# Patient Record
Sex: Male | Born: 1965 | Race: Black or African American | Hispanic: No | Marital: Single | Smoking: Never smoker
Health system: Southern US, Community
[De-identification: ages and names within clinical notes are randomized; demographics above are authoritative.]

## PROBLEM LIST (undated history)

## (undated) DIAGNOSIS — K5190 Ulcerative colitis, unspecified, without complications: Secondary | ICD-10-CM

## (undated) HISTORY — PX: COLONOSCOPY: SHX174

## (undated) HISTORY — PX: ABDOMINAL SURGERY: SHX537

---

## 2015-04-15 ENCOUNTER — Encounter (HOSPITAL_BASED_OUTPATIENT_CLINIC_OR_DEPARTMENT_OTHER): Payer: Self-pay

## 2015-04-15 ENCOUNTER — Emergency Department (HOSPITAL_BASED_OUTPATIENT_CLINIC_OR_DEPARTMENT_OTHER)
Admission: EM | Admit: 2015-04-15 | Discharge: 2015-04-15 | Disposition: A | Discharge status: Home / Self Care | Payer: No Typology Code available for payment source | Attending: Emergency Medicine | Admitting: Emergency Medicine

## 2015-04-15 DIAGNOSIS — R350 Frequency of micturition: Secondary | ICD-10-CM | POA: Diagnosis present

## 2015-04-15 DIAGNOSIS — N390 Urinary tract infection, site not specified: Secondary | ICD-10-CM | POA: Diagnosis not present

## 2015-04-15 HISTORY — DX: Ulcerative colitis, unspecified, without complications: K51.90

## 2015-04-15 LAB — URINALYSIS, ROUTINE W REFLEX MICROSCOPIC
Bilirubin Urine: NEGATIVE
Glucose, UA: NEGATIVE mg/dL
Hgb urine dipstick: NEGATIVE
Ketones, ur: NEGATIVE mg/dL
NITRITE: NEGATIVE
PH: 5.5 (ref 5.0–8.0)
Protein, ur: NEGATIVE mg/dL
SPECIFIC GRAVITY, URINE: 1.017 (ref 1.005–1.030)

## 2015-04-15 LAB — URINE MICROSCOPIC-ADD ON
RBC / HPF: NONE SEEN RBC/hpf (ref 0–5)
SQUAMOUS EPITHELIAL / LPF: NONE SEEN

## 2015-04-15 LAB — OCCULT BLOOD X 1 CARD TO LAB, STOOL: FECAL OCCULT BLD: NEGATIVE

## 2015-04-15 MED ORDER — CEPHALEXIN 500 MG PO CAPS: 1000.0000 mg | ORAL_CAPSULE | Freq: Two times a day (BID) | ORAL | Status: DC

## 2015-04-15 NOTE — ED Provider Notes (Signed)
CSN: [id number]     Arrival date & time 04/15/15  [id number] History   First MD Initiated Contact with Patient 04/15/15 [phone]     Chief Complaint  Patient presents with  . Urinary Frequency     (Consider location/radiation/quality/duration/timing/severity/associated sxs/prior Treatment) HPI   49 year old male with hx of UC presents with urinary frequency.  Pt report having frequency and occasional urgency for the past 2 days.  Today he also notice trace of blood in his urine which concerns him.  He has has blood mixed with stools in the past from UC.  He denies having abd pain, rectal pain, rectal bleeding, burning on urination, having penile discharge or rash.  He denies any recent injury or trauma.  He is sexually active.  Has had remote hx of STI including GC/Ch many years ago.  Pt cannot recall last UC flare.    Past Medical History  Diagnosis Date  . Ulcerative colitis [facility](HCC)    Past Surgical History  Procedure Laterality Date  . Abdominal surgery    . Colonoscopy     No family history on file. Social History  Substance Use Topics  . Smoking status: Never Smoker   . Smokeless tobacco: None  . Alcohol Use: No    Review of Systems  All other systems reviewed and are negative.     Allergies  Review of patient's allergies indicates no known allergies.  Home Medications   Prior to Admission medications   Not on File   BP 131/83 mmHg  Pulse 71  Temp(Src) 98.3 F (36.8 C) (Oral)  Resp 16  Ht 6' (1.829 m)  Wt 88.451 kg  BMI 26.44 kg/m2  SpO2 99% Physical Exam  Constitutional: He appears well-developed and well-nourished. No distress.  HENT:  Head: Atraumatic.  Eyes: Conjunctivae are normal.  Neck: Neck supple.  Cardiovascular: Normal rate and regular rhythm.   Pulmonary/Chest: Effort normal and breath sounds normal.  Abdominal: Soft. There is no tenderness.  Genitourinary:  Chaperone present during exam.  No inguinal lymphadenopathy or inguinal hernia noted.   Normal circumcised penis free of lesion or rash.  Testicles are nontender, normal scrotum.    On digital rectal exam, pt has normal rectal tone.  No anal fissure, no mass, normal color stool and hemoccult is negative.    No CVA tenderness  Neurological: He is alert.  Skin: No rash noted.  Psychiatric: He has a normal mood and affect.  Nursing note and vitals reviewed.   ED Course  Procedures (including critical care time)  Pt here with urinary urgency/frequency and report hematuria.  No CVA tenderness to suggest kidney stone.  abd non tender.  Will check UA.  Pt otherwise in no acute discomfort.  10:51 AM UA showing moderate leukocytes and 6-30 WBC.  No evidence of hematuria.  Given his sxs a urine culture were sent and pt d/c with keflex as treatment for suspected UTI.  Return precaution discussed.  Pt without penile discharge and declined STD testing.   Labs Review Labs Reviewed  URINALYSIS, ROUTINE W REFLEX MICROSCOPIC (NOT AT [facility]) - Abnormal; Notable for the following:    Leukocytes, UA MODERATE (*)    All other components within normal limits  URINE MICROSCOPIC-ADD ON - Abnormal; Notable for the following:    Bacteria, UA FEW (*)    All other components within normal limits  URINE CULTURE  OCCULT BLOOD X 1 CARD TO LAB, STOOL  POC OCCULT BLOOD, ED    Imaging  Review No results found. I have personally reviewed and evaluated these images and lab results as part of my medical decision-making.   EKG Interpretation None      MDM   Final diagnoses:  UTI (lower urinary tract infection)    BP 131/83 mmHg  Pulse 71  Temp(Src) 98.3 F (36.8 C) (Oral)  Resp 16  Ht 6' (1.829 m)  Wt 88.451 kg  BMI 26.44 kg/m2  SpO2 99%     Fayrene Helper, PA-C 04/15/15 1056  Gwyneth Sprout, MD 04/15/15 1419

## 2015-04-15 NOTE — ED Notes (Signed)
Reports urinary frequency.  But this am patient went to bathroom and there was blood in toliet but unsure if from his ulcerative colitis or urine.

## 2015-04-15 NOTE — Discharge Instructions (Signed)

## 2015-04-15 NOTE — ED Notes (Signed)
Denies abdominal pain.  Unsure when last ulcerative colitis flare up was.

## 2015-04-16 LAB — URINE CULTURE: Culture: NO GROWTH

## 2016-04-07 ENCOUNTER — Encounter (HOSPITAL_BASED_OUTPATIENT_CLINIC_OR_DEPARTMENT_OTHER): Payer: Self-pay | Admitting: Emergency Medicine

## 2016-04-07 ENCOUNTER — Emergency Department (HOSPITAL_BASED_OUTPATIENT_CLINIC_OR_DEPARTMENT_OTHER)
Admission: EM | Admit: 2016-04-07 | Discharge: 2016-04-07 | Disposition: A | Discharge status: Home / Self Care | Payer: No Typology Code available for payment source | Attending: Emergency Medicine | Admitting: Emergency Medicine

## 2016-04-07 DIAGNOSIS — R350 Frequency of micturition: Secondary | ICD-10-CM | POA: Insufficient documentation

## 2016-04-07 LAB — URINALYSIS, ROUTINE W REFLEX MICROSCOPIC
BILIRUBIN URINE: NEGATIVE
Glucose, UA: NEGATIVE mg/dL
HGB URINE DIPSTICK: NEGATIVE
Ketones, ur: NEGATIVE mg/dL
NITRITE: NEGATIVE
PROTEIN: NEGATIVE mg/dL
SPECIFIC GRAVITY, URINE: 1.015 (ref 1.005–1.030)
pH: 6 (ref 5.0–8.0)

## 2016-04-07 LAB — CBG MONITORING, ED: GLUCOSE-CAPILLARY: 102 mg/dL — AB (ref 65–99)

## 2016-04-07 LAB — URINE MICROSCOPIC-ADD ON

## 2016-04-07 MED ORDER — CEPHALEXIN 500 MG PO CAPS: 500.0000 mg | ORAL_CAPSULE | Freq: Three times a day (TID) | ORAL | 0 refills | Status: DC

## 2016-04-07 MED FILL — CEPHALEXIN 500 MG CAPSULE: 500 | 7 days supply | Qty: 21 | Fill #0

## 2016-04-07 NOTE — ED Triage Notes (Signed)
Pt reports urinary frequency x 2 d; denies pain.

## 2016-04-07 NOTE — ED Provider Notes (Signed)
[facility]   CSN: [id number] Arrival date & time: 04/07/16  1027     History   Chief Complaint Chief Complaint  Patient presents with  . Urinary Frequency    HPI Marcus Young is a 50 y.o. male.  HPI Patient presents the emergency department with complaints of urinary frequency over the past 2 days.  He states he is intermittently had this as a symptom over the past several years.  He states this is normally what occurs when he gets a urinary tract infection he is on antibiotics for several days and then this clears up.  Denies abdominal pain.  No nausea or vomiting.  No fevers or chills.  No diarrhea.  No penile pain.  Denies penile drainage or discharge.   Past Medical History:  Diagnosis Date  . Ulcerative colitis (HCC)     There are no active problems to display for this patient.   Past Surgical History:  Procedure Laterality Date  . ABDOMINAL SURGERY    . COLONOSCOPY         Home Medications    Prior to Admission medications   Medication Sig Start Date End Date Taking? Authorizing Provider  cephALEXin (KEFLEX) 500 MG capsule Take 1 capsule (500 mg total) by mouth 3 (three) times daily. 04/07/16   Azalia BilisKevin Cherae Marton, MD    Family History History reviewed. No pertinent family history.  Social History Social History  Substance Use Topics  . Smoking status: Never Smoker  . Smokeless tobacco: Never Used  . Alcohol use No     Allergies   Patient has no known allergies.   Review of Systems Review of Systems  All other systems reviewed and are negative.    Physical Exam Updated Vital Signs BP 129/85 (BP Location: Right Arm)   Pulse 95   Temp 98 F (36.7 C) (Oral)   Resp 18   Ht 6' (1.829 m)   Wt 198 lb (89.8 kg)   SpO2 100%   BMI 26.85 kg/m   Physical Exam  Constitutional: He is oriented to person, place, and time. He appears well-developed and well-nourished.  HENT:  Head: Normocephalic.  Eyes: EOM are normal.    Neck: Normal range of motion.  Pulmonary/Chest: Effort normal.  Abdominal: Soft. He exhibits no distension. There is no tenderness.  Musculoskeletal: Normal range of motion.  Neurological: He is alert and oriented to person, place, and time.  Psychiatric: He has a normal mood and affect.  Nursing note and vitals reviewed.    ED Treatments / Results  Labs (all labs ordered are listed, but only abnormal results are displayed) Labs Reviewed  URINALYSIS, ROUTINE W REFLEX MICROSCOPIC (NOT AT [facility]) - Abnormal; Notable for the following:       Result Value   Leukocytes, UA TRACE (*)    All other components within normal limits  URINE MICROSCOPIC-ADD ON - Abnormal; Notable for the following:    Squamous Epithelial / LPF 0-5 (*)    Bacteria, UA FEW (*)    Casts HYALINE CASTS (*)    All other components within normal limits  CBG MONITORING, ED - Abnormal; Notable for the following:    Glucose-Capillary 102 (*)    All other components within normal limits  URINE CULTURE    EKG  EKG Interpretation None       Radiology No results found.  Procedures Procedures (including critical care time)  Medications Ordered in ED Medications - No data to display  Initial Impression / Assessment and Plan / ED Course  I have reviewed the triage vital signs and the nursing notes.  Pertinent labs & imaging results that were available during my care of the patient were reviewed by me and considered in my medical decision making (see chart for details).  Clinical Course     Urine is not convincing for infection but he does have some leukocytes in it.  Urine culture sent.  Patient be started on Keflex.  Some of this may be more bladder spasm.  Vas that he keep a bladder journal as well as a dietary journal as he does drink significant soft drinks and caffeine.  This likely is playing a role.  Outpatient urology follow-up.  Final Clinical Impressions(s) / ED Diagnoses   Final diagnoses:   Urinary frequency    New Prescriptions Discharge Medication List as of 04/07/2016 11:48 AM       Azalia BilisKevin Duvid Smalls, MD 04/07/16 1650

## 2016-04-07 NOTE — ED Notes (Signed)
Urine cx was obtained by clean catch, not catheterized; charted in error.

## 2016-04-09 LAB — URINE CULTURE: CULTURE: NO GROWTH

## 2016-04-15 ENCOUNTER — Encounter (HOSPITAL_BASED_OUTPATIENT_CLINIC_OR_DEPARTMENT_OTHER): Payer: Self-pay | Admitting: *Deleted

## 2016-04-15 ENCOUNTER — Emergency Department (HOSPITAL_BASED_OUTPATIENT_CLINIC_OR_DEPARTMENT_OTHER)
Admission: EM | Admit: 2016-04-15 | Discharge: 2016-04-15 | Disposition: A | Discharge status: Home / Self Care | Payer: No Typology Code available for payment source | Attending: Emergency Medicine | Admitting: Emergency Medicine

## 2016-04-15 DIAGNOSIS — N342 Other urethritis: Secondary | ICD-10-CM | POA: Insufficient documentation

## 2016-04-15 LAB — URINALYSIS, ROUTINE W REFLEX MICROSCOPIC
BILIRUBIN URINE: NEGATIVE
Glucose, UA: NEGATIVE mg/dL
HGB URINE DIPSTICK: NEGATIVE
KETONES UR: NEGATIVE mg/dL
Nitrite: NEGATIVE
PROTEIN: NEGATIVE mg/dL
Specific Gravity, Urine: 1.016 (ref 1.005–1.030)
pH: 5 (ref 5.0–8.0)

## 2016-04-15 LAB — URINALYSIS, MICROSCOPIC (REFLEX)

## 2016-04-15 MED ORDER — DOXYCYCLINE HYCLATE 100 MG PO CAPS: 100.0000 mg | ORAL_CAPSULE | Freq: Two times a day (BID) | ORAL | 0 refills | Status: DC

## 2016-04-15 MED ORDER — CEFTRIAXONE SODIUM 250 MG IJ SOLR
250.0000 mg | Freq: Once | INTRAMUSCULAR | Status: AC
  Administered 2016-04-15: 250 mg via INTRAMUSCULAR
  Filled 2016-04-15: qty 250

## 2016-04-15 MED FILL — DOXYCYCLINE HYC 100 MG CAP: 100 | 7 days supply | Qty: 14 | Fill #0

## 2016-04-15 NOTE — ED Triage Notes (Signed)
Pt c/o freq urination x 1 week , seen here 2 days ago for same pt completed ABX w/o improvement

## 2016-04-15 NOTE — ED Notes (Signed)
Pt states he has rocephin before w/o reaction

## 2016-04-15 NOTE — ED Provider Notes (Signed)
[facility]   CSN: [id number] Arrival date & time: 04/15/16  1347     History   Chief Complaint Chief Complaint  Patient presents with  . Urinary Frequency    HPI Marcus Young is a 50 y.o. male.  HPI Patient presents with 9 days of urinary frequency, mild dysuria and occasional cloudy discharge. Was seen 7 days ago and given prescription for Keflex. States he's had no significant improvement in his symptoms. Has not followed up with urology. Denies any fever or chills. No abdominal pain. No testicular swelling or pain. Has history of BPH and describes chronic hesitancy but this is not increased. No hematuria. States he is sexually active and mostly uses condoms. Past Medical History:  Diagnosis Date  . Ulcerative colitis (HCC)     There are no active problems to display for this patient.   Past Surgical History:  Procedure Laterality Date  . ABDOMINAL SURGERY    . COLONOSCOPY         Home Medications    Prior to Admission medications   Medication Sig Start Date End Date Taking? Authorizing Provider  cephALEXin (KEFLEX) 500 MG capsule Take 1 capsule (500 mg total) by mouth 3 (three) times daily. 04/07/16   Azalia Bilis, MD  doxycycline (VIBRAMYCIN) 100 MG capsule Take 1 capsule (100 mg total) by mouth 2 (two) times daily. One po bid x 7 days 04/15/16   Loren Racer, MD    Family History History reviewed. No pertinent family history.  Social History Social History  Substance Use Topics  . Smoking status: Never Smoker  . Smokeless tobacco: Never Used  . Alcohol use No     Allergies   Patient has no known allergies.   Review of Systems Review of Systems  Constitutional: Negative for chills.  Respiratory: Negative for shortness of breath.   Cardiovascular: Negative for chest pain.  Gastrointestinal: Negative for abdominal pain, diarrhea, nausea and vomiting.  Genitourinary: Positive for difficulty urinating, discharge,  dysuria and frequency. Negative for flank pain, hematuria, penile pain, penile swelling, scrotal swelling, testicular pain and urgency.  Musculoskeletal: Negative for back pain and myalgias.  Skin: Negative for rash and wound.  Neurological: Negative for dizziness, weakness and numbness.  All other systems reviewed and are negative.    Physical Exam Updated Vital Signs BP 119/89 (BP Location: Left Arm)   Pulse 81   Temp 98.1 F (36.7 C) (Oral)   Resp 18   Ht 6' (1.829 m)   Wt 198 lb (89.8 kg)   SpO2 97%   BMI 26.85 kg/m   Physical Exam  Constitutional: He is oriented to person, place, and time. He appears well-developed and well-nourished.  HENT:  Head: Normocephalic and atraumatic.  Mouth/Throat: Oropharynx is clear and moist.  Eyes: EOM are normal. Pupils are equal, round, and reactive to light.  Neck: Normal range of motion. Neck supple.  Cardiovascular: Normal rate and regular rhythm.   Pulmonary/Chest: Effort normal and breath sounds normal.  Abdominal: Soft. Bowel sounds are normal. There is no tenderness. There is no rebound and no guarding.  Genitourinary: Rectum normal, prostate normal and penis normal. No penile tenderness.  Genitourinary Comments: Mildly enlarged prostate without bogginess or tenderness. No testicular swelling or tenderness. Testicles are in normal lie. No inguinal lymphadenopathy. No masses or rashes present. Circumcised penis without appreciated discharge.  Musculoskeletal: Normal range of motion. He exhibits no edema or tenderness.  No CVA tenderness bilaterally.  Neurological: He is alert and  oriented to person, place, and time.  Skin: Skin is warm and dry. No rash noted. No erythema.  Psychiatric: He has a normal mood and affect. His behavior is normal.  Nursing note and vitals reviewed.    ED Treatments / Results  Labs (all labs ordered are listed, but only abnormal results are displayed) Labs Reviewed  URINALYSIS, ROUTINE W REFLEX  MICROSCOPIC - Abnormal; Notable for the following:       Result Value   Leukocytes, UA SMALL (*)    All other components within normal limits  URINALYSIS, MICROSCOPIC (REFLEX) - Abnormal; Notable for the following:    Bacteria, UA FEW (*)    Squamous Epithelial / LPF 0-5 (*)    All other components within normal limits  GC/CHLAMYDIA PROBE AMP ([place]) NOT AT [facility]    EKG  EKG Interpretation None       Radiology No results found.  Procedures Procedures (including critical care time)  Medications Ordered in ED Medications  cefTRIAXone (ROCEPHIN) injection 250 mg (250 mg Intramuscular Given 04/15/16 1617)     Initial Impression / Assessment and Plan / ED Course  I have reviewed the triage vital signs and the nursing notes.  Pertinent labs & imaging results that were available during my care of the patient were reviewed by me and considered in my medical decision making (see chart for details).  Clinical Course    Patient's urine culture from 7 days ago with no growth. Still has a few bacteria and small leukocytes in the urine. Think this is likely related to urethritis. We'll send off cultures for GC and treat. Patient is advised to have all sexual partners evaluated and treated. Return precautions given.  Final Clinical Impressions(s) / ED Diagnoses   Final diagnoses:  Urethritis    New Prescriptions New Prescriptions   DOXYCYCLINE (VIBRAMYCIN) 100 MG CAPSULE    Take 1 capsule (100 mg total) by mouth 2 (two) times daily. One po bid x 7 days     Loren Raceravid Aarnav Steagall, MD 04/15/16 [phone]

## 2016-04-15 NOTE — ED Notes (Signed)
Pt states he has not followed up with urology because he cannot afford it.

## 2016-04-17 LAB — GC/CHLAMYDIA PROBE AMP (~~LOC~~) NOT AT ARMC
CHLAMYDIA, DNA PROBE: NEGATIVE
NEISSERIA GONORRHEA: NEGATIVE

## 2016-12-21 ENCOUNTER — Encounter (HOSPITAL_BASED_OUTPATIENT_CLINIC_OR_DEPARTMENT_OTHER): Payer: Self-pay | Admitting: *Deleted

## 2016-12-21 ENCOUNTER — Emergency Department (HOSPITAL_BASED_OUTPATIENT_CLINIC_OR_DEPARTMENT_OTHER)
Admission: EM | Admit: 2016-12-21 | Discharge: 2016-12-21 | Disposition: A | Discharge status: Home / Self Care | Payer: No Typology Code available for payment source | Attending: Emergency Medicine | Admitting: Emergency Medicine

## 2016-12-21 DIAGNOSIS — N390 Urinary tract infection, site not specified: Secondary | ICD-10-CM | POA: Insufficient documentation

## 2016-12-21 DIAGNOSIS — N3000 Acute cystitis without hematuria: Secondary | ICD-10-CM | POA: Insufficient documentation

## 2016-12-21 LAB — URINALYSIS, ROUTINE W REFLEX MICROSCOPIC
Bilirubin Urine: NEGATIVE
GLUCOSE, UA: NEGATIVE mg/dL
HGB URINE DIPSTICK: NEGATIVE
Ketones, ur: NEGATIVE mg/dL
Nitrite: POSITIVE — AB
PH: 5 (ref 5.0–8.0)
Protein, ur: NEGATIVE mg/dL
SPECIFIC GRAVITY, URINE: 1.015 (ref 1.005–1.030)

## 2016-12-21 LAB — URINALYSIS, MICROSCOPIC (REFLEX): RBC / HPF: NONE SEEN RBC/hpf (ref 0–5)

## 2016-12-21 MED ORDER — CEPHALEXIN 500 MG PO CAPS: 500.0000 mg | ORAL_CAPSULE | Freq: Three times a day (TID) | ORAL | 0 refills | Status: AC

## 2016-12-21 MED ORDER — CEPHALEXIN 250 MG PO CAPS
500.0000 mg | ORAL_CAPSULE | Freq: Once | ORAL | Status: AC
  Administered 2016-12-21: 500 mg via ORAL
  Filled 2016-12-21: qty 2

## 2016-12-21 MED FILL — CEPHALEXIN 500 MG CAPSULE: 500 | 10 days supply | Qty: 30 | Fill #0

## 2016-12-21 NOTE — ED Provider Notes (Signed)
[facility]   CSN: [id number] Arrival date & time: 12/21/16  1335     History   Chief Complaint Chief Complaint  Patient presents with  . Urinary Frequency    HPI Marcus Young is a 51 y.o. male.  The history is provided by the patient.  Dysuria   This is a recurrent problem. The current episode started more than 2 days ago. The problem occurs every urination. The problem has not changed since onset.Quality: no pain, freqwuency. The pain is at a severity of 0/10. The patient is experiencing no pain. There has been no fever. Associated symptoms include frequency. Pertinent negatives include no chills, no sweats, no nausea, no vomiting, no discharge, no hematuria, no hesitancy, no urgency and no flank pain. Treatments tried: AZO. His past medical history is significant for recurrent UTIs. His past medical history does not include kidney stones, single kidney or catheterization.    Past Medical History:  Diagnosis Date  . Ulcerative colitis (HCC)     There are no active problems to display for this patient.   Past Surgical History:  Procedure Laterality Date  . ABDOMINAL SURGERY    . COLONOSCOPY         Home Medications    Prior to Admission medications   Medication Sig Start Date End Date Taking? Authorizing Provider  cephALEXin (KEFLEX) 500 MG capsule Take 1 capsule (500 mg total) by mouth 3 (three) times daily. 04/07/16   Azalia Bilis, MD  doxycycline (VIBRAMYCIN) 100 MG capsule Take 1 capsule (100 mg total) by mouth 2 (two) times daily. One po bid x 7 days 04/15/16   Loren Racer, MD    Family History No family history on file.  Social History Social History  Substance Use Topics  . Smoking status: Never Smoker  . Smokeless tobacco: Never Used  . Alcohol use No     Allergies   Patient has no known allergies.   Review of Systems Review of Systems  Constitutional: Negative for chills, diaphoresis, fatigue and fever.    HENT: Negative for congestion.   Respiratory: Negative for chest tightness, shortness of breath and wheezing.   Cardiovascular: Negative for chest pain.  Gastrointestinal: Negative for abdominal distention, abdominal pain, blood in stool, diarrhea, nausea and vomiting.  Genitourinary: Positive for frequency. Negative for decreased urine volume, difficulty urinating, dysuria, flank pain, genital sores, hematuria, hesitancy and urgency.  Musculoskeletal: Negative for back pain.  Skin: Negative for rash and wound.  Neurological: Negative for dizziness and headaches.  Psychiatric/Behavioral: Negative for agitation.  All other systems reviewed and are negative.    Physical Exam Updated Vital Signs BP 119/81   Pulse 88   Temp 98.1 F (36.7 C) (Oral)   Resp 20   Ht 6' (1.829 m)   Wt 88.5 kg (195 lb)   SpO2 98%   BMI 26.45 kg/m   Physical Exam  Constitutional: He appears well-developed and well-nourished. No distress.  HENT:  Head: Normocephalic and atraumatic.  Eyes: Pupils are equal, round, and reactive to light. Conjunctivae are normal.  Neck: Neck supple.  Cardiovascular: Normal rate and intact distal pulses.   No murmur heard. Pulmonary/Chest: Effort normal and breath sounds normal. No respiratory distress. He has no wheezes. He exhibits no tenderness.  Abdominal: Soft. He exhibits no distension. There is no tenderness.  Musculoskeletal: He exhibits no edema or tenderness.  Neurological: He is alert. No sensory deficit. He exhibits normal muscle tone.  Skin: Skin is warm and  dry. He is not diaphoretic. No erythema. No pallor.  Psychiatric: He has a normal mood and affect.  Nursing note and vitals reviewed.    ED Treatments / Results  Labs (all labs ordered are listed, but only abnormal results are displayed) Labs Reviewed  URINALYSIS, ROUTINE W REFLEX MICROSCOPIC - Abnormal; Notable for the following:       Result Value   Color, Urine ORANGE (*)    Nitrite POSITIVE  (*)    Leukocytes, UA SMALL (*)    All other components within normal limits  URINALYSIS, MICROSCOPIC (REFLEX) - Abnormal; Notable for the following:    Bacteria, UA FEW (*)    Squamous Epithelial / LPF 0-5 (*)    All other components within normal limits  URINE CULTURE    EKG  EKG Interpretation None       Radiology No results found.  Procedures Procedures (including critical care time)  Medications Ordered in ED Medications  cephALEXin (KEFLEX) capsule 500 mg (500 mg Oral Given 12/21/16 1512)     Initial Impression / Assessment and Plan / ED Course  I have reviewed the triage vital signs and the nursing notes.  Pertinent labs & imaging results that were available during my care of the patient were reviewed by me and considered in my medical decision making (see chart for details).     Marcus Young is a 51 y.o. male with a past medical history significant for ulcerative colitis and recurrent urinary tract infections who presents with urinary frequency. Patient says that for the last week, he has had urinary frequency. He reports that he has no itching like he has in the past but reports that he began taking Azo. He says that he continues to have urinary frequency which is consistent with prior UTIs. He denies any abdominal pain. Patient denies fevers, chills, nausea, vomiting, or changes in bowel movements. Denies any recent dramatic injuries. He denies any scrotal pain, penile pain, or any ulcerations in the groin. He denies any other complaints.  On exam, abdomen nontender. Lungs clear. No focal neurologic deficits. Patient in no pain. No CVA tenderness. His vital signs reassuring.  Urinalysis performed showing evidence of UTI. Patient was nitrite and leukocyte positive with bacteria. Given patient's history and these findings, patient will be treated for UTI. Urine culture will be sent.  Given lack of CVA tenderness, nausea, vomiting, or fevers, doubt patient has  pyelonephritis. Patient denies history of diabetes or other significant abnormalities. Anticipate following up with PCP to determine etiology of his recurrent UTIs.  Patient given dose of antibiotics in the ED. Patient will be prescribed antibiotics at discharge. Patient will be given follow up instructions with PCP and a urologist given his recurrent UTIs. Patient understood return precautions for new or worsened symptoms. Patient discharged in good condition.   Final Clinical Impressions(s) / ED Diagnoses   Final diagnoses:  Lower urinary tract infectious disease  Acute cystitis without hematuria    New Prescriptions Discharge Medication List as of 12/21/2016  3:18 PM    START taking these medications   Details  !! cephALEXin (KEFLEX) 500 MG capsule Take 1 capsule (500 mg total) by mouth 3 (three) times daily., Starting Mon 12/21/2016, Until Thu 12/31/2016, Print     !! - Potential duplicate medications found. Please discuss with provider.     Clinical Impression: 1. Lower urinary tract infectious disease   2. Acute cystitis without hematuria     Disposition: Discharge  Condition: Good  I  have discussed the results, Dx and Tx plan with the pt(& family if present). He/she/they expressed understanding and agree(s) with the plan. Discharge instructions discussed at great length. Strict return precautions discussed and pt &/or family have verbalized understanding of the instructions. No further questions at time of discharge.    Discharge Medication List as of 12/21/2016  3:18 PM    START taking these medications   Details  !! cephALEXin (KEFLEX) 500 MG capsule Take 1 capsule (500 mg total) by mouth 3 (three) times daily., Starting Mon 12/21/2016, Until Thu 12/31/2016, Print     !! - Potential duplicate medications found. Please discuss with provider.      Follow Up: [facility] [address] [place]  [id number] [phone] Schedule an appointment as soon as possible for a visit    [facility] [address] [place] [address] [phone] Schedule an appointment as soon as possible for a visit       Tegeler, Canary Brim, MD 12/22/16 [phone]

## 2016-12-21 NOTE — ED Notes (Signed)
ED Provider at bedside. 

## 2016-12-21 NOTE — Discharge Instructions (Signed)
Please take her antibiotics to treat the urinary tract infection. Please stay hydrated. Please follow-up with a primary care physician for further management. Also included is a referral to urology for workup of your recurrent urinary tract infections. If any symptoms change or worsen or you begin having nausea with vomiting, fevers, or chills, please return to the nearest emergency department.

## 2016-12-21 NOTE — ED Triage Notes (Signed)
Urinary frequency. States he has been taking OTC AZO.

## 2016-12-23 LAB — URINE CULTURE: Culture: NO GROWTH

## 2016-12-28 ENCOUNTER — Encounter (HOSPITAL_BASED_OUTPATIENT_CLINIC_OR_DEPARTMENT_OTHER): Payer: Self-pay

## 2016-12-28 ENCOUNTER — Emergency Department (HOSPITAL_BASED_OUTPATIENT_CLINIC_OR_DEPARTMENT_OTHER)
Admission: EM | Admit: 2016-12-28 | Discharge: 2016-12-28 | Disposition: A | Discharge status: Home / Self Care | Payer: Self-pay | Attending: Emergency Medicine | Admitting: Emergency Medicine

## 2016-12-28 DIAGNOSIS — R3915 Urgency of urination: Secondary | ICD-10-CM | POA: Insufficient documentation

## 2016-12-28 DIAGNOSIS — R350 Frequency of micturition: Secondary | ICD-10-CM

## 2016-12-28 LAB — URINALYSIS, MICROSCOPIC (REFLEX)

## 2016-12-28 LAB — URINALYSIS, ROUTINE W REFLEX MICROSCOPIC
Bilirubin Urine: NEGATIVE
GLUCOSE, UA: NEGATIVE mg/dL
Hgb urine dipstick: NEGATIVE
Ketones, ur: NEGATIVE mg/dL
Nitrite: NEGATIVE
PH: 5.5 (ref 5.0–8.0)
Protein, ur: NEGATIVE mg/dL
Specific Gravity, Urine: 1.017 (ref 1.005–1.030)

## 2016-12-28 MED ORDER — METRONIDAZOLE 500 MG PO TABS
2000.0000 mg | ORAL_TABLET | Freq: Once | ORAL | Status: AC
  Administered 2016-12-28: 2000 mg via ORAL
  Filled 2016-12-28: qty 4

## 2016-12-28 MED ORDER — CEFTRIAXONE SODIUM 250 MG IJ SOLR
250.0000 mg | Freq: Once | INTRAMUSCULAR | Status: AC
  Administered 2016-12-28: 250 mg via INTRAMUSCULAR
  Filled 2016-12-28: qty 250

## 2016-12-28 MED ORDER — DOXYCYCLINE HYCLATE 100 MG PO CAPS: 100.0000 mg | ORAL_CAPSULE | Freq: Two times a day (BID) | ORAL | 0 refills | Status: AC

## 2016-12-28 MED ORDER — AZITHROMYCIN 250 MG PO TABS
1000.0000 mg | ORAL_TABLET | Freq: Once | ORAL | Status: AC
  Administered 2016-12-28: 1000 mg via ORAL
  Filled 2016-12-28: qty 4

## 2016-12-28 MED FILL — DOXYCYCLINE HYC 100 MG CAP: 100 | 7 days supply | Qty: 14 | Fill #0

## 2016-12-28 NOTE — ED Triage Notes (Signed)
C/o cont'd urinary freq/urgency, itching-now diarrhea-seen here last week and treated for UTI per pt-NAD-steady gait

## 2016-12-28 NOTE — ED Provider Notes (Signed)
[facility] Provider Note   CSN: [id number] Arrival date & time: 12/28/16  1358  History   Chief Complaint Chief Complaint  Patient presents with  . Urinary Frequency    HPI Marcus Young is a 51 y.o. male with history of recurrent UTIs presents to ED for evaluation of urinary frequency, urgency, itching of urinary tract x 7-10 days. States has had similar symptoms in past and usually resolve with antibiotics but not this time. He was evaluated in the ED for urinary frequency and urgency one week ago, finished Keflex which did not help his symptoms. Now also having itching and penile discharge. He is sexually active with women with inconsistent condom use, no new partners in the last month. Cannot remember her last STD testing.  Denies fevers, nausea, vomiting, abdominal pain, back pain, hematuria, dysuria, genital lesions. Reports history of ulcerative colitis untreated, stopped taking Lialda 2 years ago. Denies diarrhea, melena or hematochezia. Reports last prostate exam was one year ago, was told it was normal.  HPI  Past Medical History:  Diagnosis Date  . Ulcerative colitis (HCC)     There are no active problems to display for this patient.   Past Surgical History:  Procedure Laterality Date  . ABDOMINAL SURGERY    . COLONOSCOPY         Home Medications    Prior to Admission medications   Medication Sig Start Date End Date Taking? Authorizing Provider  cephALEXin (KEFLEX) 500 MG capsule Take 1 capsule (500 mg total) by mouth 3 (three) times daily. 12/21/16 12/31/16  Tegeler, Canary Brim, MD  doxycycline (VIBRAMYCIN) 100 MG capsule Take 1 capsule (100 mg total) by mouth 2 (two) times daily. 12/28/16 01/04/17  Liberty Handy, PA-C    Family History No family history on file.  Social History Social History  Substance Use Topics  . Smoking status: Never Smoker  . Smokeless tobacco: Never Used  . Alcohol use No     Allergies   Patient has no  known allergies.   Review of Systems Review of Systems  Constitutional: Negative for chills and fever.  Gastrointestinal: Negative for abdominal pain, nausea and vomiting.  Genitourinary: Positive for difficulty urinating, discharge, frequency and urgency. Negative for decreased urine volume, dysuria, genital sores, hematuria, penile swelling, scrotal swelling and testicular pain.       +itching     Physical Exam Updated Vital Signs BP 119/82 (BP Location: Left Arm)   Pulse 86   Temp 98.2 F (36.8 C) (Oral)   Resp 20   Ht 6' (1.829 m)   Wt 90.5 kg (199 lb 8.3 oz)   SpO2 98%   BMI 27.06 kg/m   Physical Exam  Constitutional: He is oriented to person, place, and time. He appears well-developed and well-nourished. No distress.  NAD.  HENT:  Head: Normocephalic and atraumatic.  Right Ear: External ear normal.  Left Ear: External ear normal.  Nose: Nose normal.  No perioral or intraoral lesions Posterior oropharynx and tonsils normal  Eyes: Conjunctivae and EOM are normal. No scleral icterus.  Neck: Normal range of motion. Neck supple.  Cardiovascular: Normal rate, regular rhythm, normal heart sounds and intact distal pulses.   No murmur heard. Pulmonary/Chest: Effort normal and breath sounds normal. He has no wheezes.  Abdominal: Soft. There is no tenderness.  No suprapubic or CVA tenderness  Genitourinary:  Genitourinary Comments: Decline rectal/prostate exam Chaperone was present during exam External genitalia normal without erythema, edema, tenderness or lesions.  Circumcised male.  No groin lymphadenopathy.  No meatus discharge.  Glans and shaft smooth without tenderness, lesions, masses or deformity.  Scrotum without lesions or edema.  Non tender testicles. Epididymis and spermatic cord without tenderness or masses, bilaterally. Cremasteric reflex intact.  Musculoskeletal: Normal range of motion. He exhibits no deformity.  Neurological: He is alert and oriented  to person, place, and time.  Skin: Skin is warm and dry. Capillary refill takes less than 2 seconds.  Psychiatric: He has a normal mood and affect. His behavior is normal. Judgment and thought content normal.  Nursing note and vitals reviewed.    ED Treatments / Results  Labs (all labs ordered are listed, but only abnormal results are displayed) Labs Reviewed  URINALYSIS, ROUTINE W REFLEX MICROSCOPIC - Abnormal; Notable for the following:       Result Value   Leukocytes, UA SMALL (*)    All other components within normal limits  URINALYSIS, MICROSCOPIC (REFLEX) - Abnormal; Notable for the following:    Bacteria, UA RARE (*)    Squamous Epithelial / LPF 0-5 (*)    All other components within normal limits  URINE CULTURE  GC/CHLAMYDIA PROBE AMP ([facility]) NOT AT [facility]    EKG  EKG Interpretation None       Radiology No results found.  Procedures Procedures (including critical care time)  Medications Ordered in ED Medications  cefTRIAXone (ROCEPHIN) injection 250 mg (250 mg Intramuscular Given 12/28/16 1633)  metroNIDAZOLE (FLAGYL) tablet 2,000 mg (2,000 mg Oral Given 12/28/16 1632)  azithromycin (ZITHROMAX) tablet 1,000 mg (1,000 mg Oral Given 12/28/16 1632)     Initial Impression / Assessment and Plan / ED Course  I have reviewed the triage vital signs and the nursing notes.  Pertinent labs & imaging results that were available during my care of the patient were reviewed by me and considered in my medical decision making (see chart for details).     51 year old male with history of untreated ulcerative colitis and recurrent UTIs presents to the ED for evaluation of frequency, urgency, penile itching and discharge for 7-10 days. Was evaluated in the ED last week for frequency and urgency, then U/A with positive nitrites and leukocytes. He has completed a course of Keflex, without resolution of symptoms and now endorsing penile discharge and itching. Denied symptoms of  ulcerative colitis flare including bloody diarrhea, fever, abdominal pain, tenesmus.  Exam is reassuring w/o CVA or suprapubic tenderness. Patient declined rectal and prostate exam.  Final Clinical Impressions(s) / ED Diagnoses   Final diagnoses:  Urgency of urination  Frequency of urination   Urinalysis without evidence of infection today. We will send urine culture and GC/chlamydia testing. Given high-risk sexual practices, patient will be empirically treated for STDs. We'll discharge with doxycycline, GU and GI follow-up. Patient does not have insurance and states he will likely be unable to follow-up with specialist. Provided him contact information for community clinic where he can establish care with a provider to follow up his recurrent UTI symptoms. Strict ED return precautions given. Pt verbalized understanding and is agreeable to plan.   New Prescriptions Discharge Medication List as of 12/28/2016  4:42 PM    START taking these medications   Details  doxycycline (VIBRAMYCIN) 100 MG capsule Take 1 capsule (100 mg total) by mouth 2 (two) times daily., Starting Mon 12/28/2016, Until Mon 01/04/2017, Print         Liberty Handy, PA-C 12/28/16 1724    Charlynne Pander, MD  12/29/16 1504  

## 2016-12-28 NOTE — Discharge Instructions (Signed)
You were evaluated in the ED for urinary frequency, urgency and itching. Your urinalysis did not show convincing evidence of infection, we are still waiting for culture to confirm. You were tested for gonorrhea and chlamydia today, the results are pending. You will be contacted if these results are positive and if your urine culture returns positive via phone call within 2-3 days.   Take doxycycline until completed, this will finish the course of STD treatment.   Contact urology and gastroenterology as soon as possible for further evaluation. In some rare cases ulcerative colitis can cause fistulas into your urinary tract, return to ED if you have dark penile discharge, diarrhea, fevers, abdominal pain or your symptoms persist or worsen.   Contact [facility] to establish care with a primary care provider for regular, routine medical care.  This clinic accepts patients without medical insurance and may help you establish care with GI and urology. A primary care provider can adjust your daily medications and give you refills.

## 2016-12-29 LAB — GC/CHLAMYDIA PROBE AMP (~~LOC~~) NOT AT ARMC
Chlamydia: NEGATIVE
NEISSERIA GONORRHEA: NEGATIVE

## 2016-12-29 LAB — URINE CULTURE: Culture: NO GROWTH

## 2017-01-01 NOTE — ED Notes (Signed)
Pt. Called for results of STD.  Results given.  Pt. Verbalized understanding.

## 2017-12-16 ENCOUNTER — Encounter (HOSPITAL_BASED_OUTPATIENT_CLINIC_OR_DEPARTMENT_OTHER): Payer: Self-pay | Admitting: Emergency Medicine

## 2017-12-16 ENCOUNTER — Emergency Department (HOSPITAL_BASED_OUTPATIENT_CLINIC_OR_DEPARTMENT_OTHER)
Admission: EM | Admit: 2017-12-16 | Discharge: 2017-12-16 | Disposition: A | Discharge status: Home / Self Care | Payer: Self-pay | Attending: Emergency Medicine | Admitting: Emergency Medicine

## 2017-12-16 ENCOUNTER — Other Ambulatory Visit: Payer: Self-pay

## 2017-12-16 DIAGNOSIS — T24212A Burn of second degree of left thigh, initial encounter: Secondary | ICD-10-CM | POA: Insufficient documentation

## 2017-12-16 DIAGNOSIS — Y929 Unspecified place or not applicable: Secondary | ICD-10-CM | POA: Insufficient documentation

## 2017-12-16 DIAGNOSIS — Y999 Unspecified external cause status: Secondary | ICD-10-CM | POA: Insufficient documentation

## 2017-12-16 DIAGNOSIS — T310 Burns involving less than 10% of body surface: Secondary | ICD-10-CM | POA: Insufficient documentation

## 2017-12-16 DIAGNOSIS — X18XXXA Contact with other hot metals, initial encounter: Secondary | ICD-10-CM | POA: Insufficient documentation

## 2017-12-16 DIAGNOSIS — Y9389 Activity, other specified: Secondary | ICD-10-CM | POA: Insufficient documentation

## 2017-12-16 DIAGNOSIS — T24232A Burn of second degree of left lower leg, initial encounter: Secondary | ICD-10-CM

## 2017-12-16 DIAGNOSIS — Z23 Encounter for immunization: Secondary | ICD-10-CM | POA: Insufficient documentation

## 2017-12-16 MED ORDER — HYDROCODONE-ACETAMINOPHEN 5-325 MG PO TABS: 1.0000 | ORAL_TABLET | ORAL | 0 refills | Status: DC | PRN

## 2017-12-16 MED ORDER — CEPHALEXIN 250 MG PO CAPS
500.0000 mg | ORAL_CAPSULE | Freq: Once | ORAL | Status: AC
  Administered 2017-12-16: 500 mg via ORAL
  Filled 2017-12-16: qty 2

## 2017-12-16 MED ORDER — TETANUS-DIPHTH-ACELL PERTUSSIS 5-2.5-18.5 LF-MCG/0.5 IM SUSP
0.5000 mL | Freq: Once | INTRAMUSCULAR | Status: AC
  Administered 2017-12-16: 0.5 mL via INTRAMUSCULAR
  Filled 2017-12-16: qty 0.5

## 2017-12-16 MED ORDER — CEPHALEXIN 500 MG PO CAPS: 500.0000 mg | ORAL_CAPSULE | Freq: Four times a day (QID) | ORAL | 0 refills | Status: AC

## 2017-12-16 MED FILL — HYDROCODON-APAP 5-325: 5-325 | 2 days supply | Qty: 12 | Fill #0

## 2017-12-16 MED FILL — CEPHALEXIN 500 MG CAPSULE: 500 | 7 days supply | Qty: 28 | Fill #0
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
## 2017-12-18 ENCOUNTER — Telehealth (HOSPITAL_BASED_OUTPATIENT_CLINIC_OR_DEPARTMENT_OTHER): Payer: Self-pay | Admitting: Emergency Medicine

## 2018-06-08 ENCOUNTER — Emergency Department (HOSPITAL_BASED_OUTPATIENT_CLINIC_OR_DEPARTMENT_OTHER): Payer: Self-pay

## 2018-06-08 ENCOUNTER — Encounter (HOSPITAL_BASED_OUTPATIENT_CLINIC_OR_DEPARTMENT_OTHER): Payer: Self-pay

## 2018-06-08 ENCOUNTER — Emergency Department (HOSPITAL_BASED_OUTPATIENT_CLINIC_OR_DEPARTMENT_OTHER)
Admission: EM | Admit: 2018-06-08 | Discharge: 2018-06-08 | Disposition: A | Discharge status: Home / Self Care | Payer: Self-pay | Attending: Emergency Medicine | Admitting: Emergency Medicine

## 2018-06-08 ENCOUNTER — Other Ambulatory Visit: Payer: Self-pay

## 2018-06-08 DIAGNOSIS — J029 Acute pharyngitis, unspecified: Secondary | ICD-10-CM | POA: Insufficient documentation

## 2018-06-08 DIAGNOSIS — M791 Myalgia, unspecified site: Secondary | ICD-10-CM | POA: Insufficient documentation

## 2018-06-08 DIAGNOSIS — J111 Influenza due to unidentified influenza virus with other respiratory manifestations: Secondary | ICD-10-CM

## 2018-06-08 DIAGNOSIS — R05 Cough: Secondary | ICD-10-CM | POA: Insufficient documentation

## 2018-06-08 DIAGNOSIS — R69 Illness, unspecified: Secondary | ICD-10-CM

## 2018-06-08 MED ORDER — ACETAMINOPHEN 325 MG PO TABS: 650.0000 mg | ORAL_TABLET | Freq: Four times a day (QID) | ORAL | 0 refills | Status: DC | PRN

## 2018-06-08 MED ORDER — BENZONATATE 100 MG PO CAPS: 200.0000 mg | ORAL_CAPSULE | Freq: Three times a day (TID) | ORAL | 0 refills | Status: DC

## 2018-06-08 MED ORDER — CETIRIZINE HCL 5 MG PO TABS: 5.0000 mg | ORAL_TABLET | Freq: Every day | ORAL | 0 refills | Status: DC

## 2018-06-08 MED ORDER — FLUTICASONE PROPIONATE 50 MCG/ACT NA SUSP: 1.0000 | Freq: Every day | NASAL | 2 refills | Status: DC

## 2018-06-08 MED FILL — BENZONATATE 100 MG CAP: 100 | 3 days supply | Qty: 21 | Fill #0

## 2018-06-08 MED FILL — CETIRIZINE HCL 10 MG TABS: 10 | 200 days supply | Qty: 100 | Fill #0

## 2018-06-08 MED FILL — ACETAMINOPHEN 325 MG TABLET: 325 | 12 days supply | Qty: 100 | Fill #0

## 2018-06-08 NOTE — ED Provider Notes (Signed)
[facility] EMERGENCY DEPARTMENT Provider Note   CSN: [id number] Arrival date & time: 06/08/18  1137     History   Chief Complaint Chief Complaint  Patient presents with  . Cough    HPI Marcus Young is a 53 y.o. male who presents to ED for 2-day history of flulike symptoms including generalized body aches, cough productive with mucus, sore throat, sinus pain and pressure.  He has been taking TheraFlu with no improvement in his symptoms.  He did not receive his influenza vaccine this year.  He denies sick contacts with similar symptoms.  Denies any chest pain, hemoptysis, trouble swallowing, recent travel.  HPI  Past Medical History:  Diagnosis Date  . Ulcerative colitis (HCC)     There are no active problems to display for this patient.   Past Surgical History:  Procedure Laterality Date  . ABDOMINAL SURGERY    . COLONOSCOPY          Home Medications    Prior to Admission medications   Medication Sig Start Date End Date Taking? Authorizing Provider  Phenylephrine-Pheniramine-DM [facility] COLD & COUGH) 02-28-19 MG PACK Take 1 packet by mouth.   Yes [provider]  acetaminophen (TYLENOL) 325 MG tablet Take 2 tablets (650 mg total) by mouth every 6 (six) hours as needed. 06/08/18   Ailyn Gladd, PA-C  benzonatate (TESSALON) 100 MG capsule Take 2 capsules (200 mg total) by mouth every 8 (eight) hours. 06/08/18   Thuy Atilano, PA-C  cetirizine (ZYRTEC) 5 MG tablet Take 1 tablet (5 mg total) by mouth daily. 06/08/18   Athanasia Stanwood, PA-C  fluticasone (FLONASE) 50 MCG/ACT nasal spray Place 1 spray into both nostrils daily. 06/08/18   Riot Waterworth, PA-C  HYDROcodone-acetaminophen (NORCO/VICODIN) 5-325 MG tablet Take 1 tablet by mouth every 4 (four) hours as needed. 12/16/17   Tegeler, Canary Brim, MD    Family History No family history on file.  Social History Social History   Tobacco Use  . Smoking status: Never Smoker  . Smokeless tobacco: Never  Used  Substance Use Topics  . Alcohol use: No  . Drug use: No     Allergies   Patient has no known allergies.   Review of Systems Review of Systems  Constitutional: Positive for chills. Negative for fever.  HENT: Positive for congestion, sinus pressure, sinus pain and sore throat. Negative for ear pain.   Respiratory: Positive for cough.   Gastrointestinal: Negative for vomiting.     Physical Exam Updated Vital Signs BP 118/72 (BP Location: Left Arm)   Pulse 90   Temp 98.7 F (37.1 C) (Oral)   Resp 20   Ht 6' (1.829 m)   Wt 88.5 kg   SpO2 95%   BMI 26.45 kg/m   Physical Exam Vitals signs and nursing note reviewed.  Constitutional:      General: He is not in acute distress.    Appearance: He is well-developed. He is not diaphoretic.  HENT:     Head: Normocephalic and atraumatic.     Right Ear: A middle ear effusion is present.     Left Ear: A middle ear effusion is present.     Mouth/Throat:     Pharynx: Oropharynx is clear. Uvula midline.     Tonsils: Swelling: 0 on the right. 0 on the left.     Comments: Patient does not appear to be in acute distress. No trismus or drooling present. No pooling of secretions. Patient is tolerating secretions and  is not in respiratory distress. No neck pain or tenderness to palpation of the neck. Full active and passive range of motion of the neck. No evidence of RPA or PTA. Eyes:     General: No scleral icterus.    Conjunctiva/sclera: Conjunctivae normal.  Neck:     Musculoskeletal: Normal range of motion.  Cardiovascular:     Rate and Rhythm: Normal rate and regular rhythm.     Heart sounds: Normal heart sounds.  Pulmonary:     Effort: Pulmonary effort is normal. No respiratory distress.     Breath sounds: Normal breath sounds.  Skin:    Findings: No rash.  Neurological:     Mental Status: He is alert.      ED Treatments / Results  Labs (all labs ordered are listed, but only abnormal results are displayed) Labs  Reviewed - No data to display  EKG None  Radiology Dg Chest 2 View  Result Date: 06/08/2018 CLINICAL DATA:  Cough, shortness of breath, and fever for 2 days. EXAM: CHEST - 2 VIEW COMPARISON:  09/03/2015 FINDINGS: The heart size and mediastinal contours are within normal limits. Both lungs are clear. The visualized skeletal structures are unremarkable. IMPRESSION: No active cardiopulmonary disease. Electronically Signed   By: Myles RosenthalJohn  Stahl M.D.   On: 06/08/2018 13:17    Procedures Procedures (including critical care time)  Medications Ordered in ED Medications - No data to display   Initial Impression / Assessment and Plan / ED Course  I have reviewed the triage vital signs and the nursing notes.  Pertinent labs & imaging results that were available during my care of the patient were reviewed by me and considered in my medical decision making (see chart for details).     Patient with symptoms consistent with influenza.  Vitals are stable, low-grade fever.  No signs of dehydration, tolerating PO's.  Lungs are clear.  Chest x-ray is unremarkable.  Discussed the cost versus benefit of Tamiflu treatment with the patient.  The patient understands that symptoms are greater than the recommended 24-48 hour window of treatment.  Patient will be discharged with instructions to orally hydrate, rest, and use over-the-counter medications such as anti-inflammatories ibuprofen and Aleve for muscle aches and Tylenol for fever.  Patient will also be given a cough suppressant, other symptomatic treatment.  Patient is hemodynamically stable, in NAD, and able to ambulate in the ED. Evaluation does not show pathology that would require ongoing emergent intervention or inpatient treatment. I explained the diagnosis to the patient. Pain has been managed and has no complaints prior to discharge. Patient is comfortable with above plan and is stable for discharge at this time. All questions were answered prior to  disposition. Strict return precautions for returning to the ED were discussed. Encouraged follow up with PCP.    Portions of this note were generated with Scientist, clinical (histocompatibility and immunogenetics)Dragon dictation software. Dictation errors may occur despite best attempts at proofreading.    Final Clinical Impressions(s) / ED Diagnoses   Final diagnoses:  Influenza-like illness    ED Discharge Orders         Ordered    benzonatate (TESSALON) 100 MG capsule  Every 8 hours     06/08/18 1352    fluticasone (FLONASE) 50 MCG/ACT nasal spray  Daily     06/08/18 1352    cetirizine (ZYRTEC) 5 MG tablet  Daily     06/08/18 1352    acetaminophen (TYLENOL) 325 MG tablet  Every 6 hours PRN  06/08/18 1352           Dietrich Pates, PA-C 06/08/18 1353    Tegeler, Canary Brim, MD 06/08/18 [phone]

## 2018-06-08 NOTE — ED Triage Notes (Signed)
C/o flu like sx day 3-NAD-steady gait 

## 2018-06-08 NOTE — ED Notes (Signed)
ED Provider at bedside. 

## 2018-06-08 NOTE — Discharge Instructions (Signed)
Return to ED for worsening symptoms, chest pain, vomiting or coughing up blood, increased shortness of breath.

## 2018-10-13 ENCOUNTER — Other Ambulatory Visit: Payer: Self-pay

## 2018-10-13 ENCOUNTER — Encounter (HOSPITAL_BASED_OUTPATIENT_CLINIC_OR_DEPARTMENT_OTHER): Payer: Self-pay

## 2018-10-13 ENCOUNTER — Emergency Department (HOSPITAL_BASED_OUTPATIENT_CLINIC_OR_DEPARTMENT_OTHER)
Admission: EM | Admit: 2018-10-13 | Discharge: 2018-10-13 | Disposition: A | Discharge status: Home / Self Care | Payer: Self-pay | Attending: Emergency Medicine | Admitting: Emergency Medicine

## 2018-10-13 DIAGNOSIS — Z79899 Other long term (current) drug therapy: Secondary | ICD-10-CM | POA: Insufficient documentation

## 2018-10-13 DIAGNOSIS — L249 Irritant contact dermatitis, unspecified cause: Secondary | ICD-10-CM | POA: Insufficient documentation

## 2018-10-13 MED ORDER — HYDROCORTISONE 1 % EX CREA: TOPICAL_CREAM | CUTANEOUS | 0 refills | Status: DC

## 2018-10-13 MED ORDER — PREDNISONE 20 MG PO TABS: 40.0000 mg | ORAL_TABLET | Freq: Every day | ORAL | 0 refills | Status: AC

## 2018-10-13 MED ORDER — LORATADINE 10 MG PO TABS
10.0000 mg | ORAL_TABLET | Freq: Every day | ORAL | 0 refills | Status: DC
Start: 2018-10-13 — End: 2023-10-16

## 2018-10-13 NOTE — ED Provider Notes (Signed)
[facility] EMERGENCY DEPARTMENT Provider Note   CSN: [id number] Arrival date & time: 10/13/18  2007  History   Chief Complaint Chief Complaint  Patient presents with   Rash    HPI Marcus Young is a 53 y.o. male with past medical history significant for ulcerative colitis who presents for evaluation of rash to his left arm.  Patient states he noticed 2 areas which have been extremely pruritic to the flexor crease of his left upper extremity.  Patient states this developed after he was in the yard.  It is unknown if he touched any poison ivy.  Denies any pain to the area.  Denies history of eczema.  Denies fever, chills, nausea, vomiting, chest pain, shortness of breath, abdominal pain, decreased range of motion next extremities, joint pain, numbness or tingling in his extremities, redness, swelling, warmth.  Has not taken anything for his symptoms.  Symptoms have been constant in nature.  Denies any known insect bites, new medicines, new perfumes or new lotions.  Denies prior history of MRSA. Denies hx of IVDU.  History obtained from patient.  No interpreter was used.     HPI  Past Medical History:  Diagnosis Date   Ulcerative colitis (HCC)     There are no active problems to display for this patient.   Past Surgical History:  Procedure Laterality Date   ABDOMINAL SURGERY     COLONOSCOPY          Home Medications    Prior to Admission medications   Medication Sig Start Date End Date Taking? Authorizing Provider  acetaminophen (TYLENOL) 325 MG tablet Take 2 tablets (650 mg total) by mouth every 6 (six) hours as needed. 06/08/18   Khatri, Hina, PA-C  benzonatate (TESSALON) 100 MG capsule Take 2 capsules (200 mg total) by mouth every 8 (eight) hours. 06/08/18   Khatri, Hina, PA-C  cetirizine (ZYRTEC) 5 MG tablet Take 1 tablet (5 mg total) by mouth daily. 06/08/18   Khatri, Hina, PA-C  fluticasone (FLONASE) 50 MCG/ACT nasal spray Place 1 spray into both nostrils  daily. 06/08/18   Khatri, Hina, PA-C  HYDROcodone-acetaminophen (NORCO/VICODIN) 5-325 MG tablet Take 1 tablet by mouth every 4 (four) hours as needed. 12/16/17   Tegeler, Canary Brimhristopher J, MD  hydrocortisone cream 1 % Apply to affected area 2 times daily 10/13/18   Wayde Gopaul A, PA-C  loratadine (CLARITIN) 10 MG tablet Take 1 tablet (10 mg total) by mouth daily. 10/13/18   Isabele Lollar A, PA-C  Phenylephrine-Pheniramine-DM (THERAFLU COLD & COUGH) 02-28-19 MG PACK Take 1 packet by mouth.    [provider]  predniSONE (DELTASONE) 20 MG tablet Take 2 tablets (40 mg total) by mouth daily for 4 days. 10/13/18 10/17/18  Issaih Kaus A, PA-C    Family History No family history on file.  Social History Social History   Tobacco Use   Smoking status: Never Smoker   Smokeless tobacco: Never Used  Substance Use Topics   Alcohol use: No   Drug use: No     Allergies   Patient has no known allergies.   Review of Systems Review of Systems  Constitutional: Negative.   HENT: Negative.   Eyes: Negative.   Respiratory: Negative.   Cardiovascular: Negative.   Gastrointestinal: Negative.   Genitourinary: Negative.   Musculoskeletal: Negative.   Skin: Positive for rash.  Neurological: Negative.   All other systems reviewed and are negative.    Physical Exam Updated Vital Signs BP 111/76 (BP Location:  Left Arm)    Pulse 92    Temp 98.5 F (36.9 C) (Oral)    Resp 16    Ht 6' (1.829 m)    Wt 88.5 kg    SpO2 98%    BMI 26.45 kg/m   Physical Exam Vitals signs and nursing note reviewed.  Constitutional:      General: He is not in acute distress.    Appearance: He is well-developed. He is not ill-appearing, toxic-appearing or diaphoretic.  HENT:     Head: Normocephalic and atraumatic.     Nose: Nose normal.     Mouth/Throat:     Mouth: Mucous membranes are moist.     Pharynx: Oropharynx is clear.     Comments: No oral lesions.  Patent airway.  Mucous membranes  moist. Eyes:     Pupils: Pupils are equal, round, and reactive to light.  Neck:     Musculoskeletal: Normal range of motion and neck supple. No neck rigidity or muscular tenderness.  Cardiovascular:     Rate and Rhythm: Normal rate and regular rhythm.     Pulses: Normal pulses.     Heart sounds: Normal heart sounds. No murmur. No friction rub. No gallop.   Pulmonary:     Effort: Pulmonary effort is normal. No respiratory distress.     Breath sounds: Normal breath sounds. No stridor. No wheezing, rhonchi or rales.     Comments: No respiratory distress.  Speaks in full sentences without difficulty. Abdominal:     General: There is no distension.     Palpations: Abdomen is soft.     Comments: Soft, nontender without rebound or guarding.  Musculoskeletal: Normal range of motion.     Left shoulder: Normal.     Right elbow: Normal.    Left elbow: Normal.     Right wrist: Normal.     Left wrist: Normal.     Right upper arm: Normal.     Left upper arm: Normal.     Left forearm: Normal.     Comments: Full range of motion bilateral upper extremities without difficulty.  He has no obvious injury.  No deformity, swelling or edema.  No tenderness to bilateral upper extremities.  Lymphadenopathy:     Cervical: No cervical adenopathy.  Skin:    General: Skin is warm and dry.     Comments: 2 linear vesicular lesions in flexural crease of left upper extremity.  No surrounding erythema or warmth.  No bulla, target lesions, pustules.  Brisk capillary refill.  No induration or fluctuance.  Brisk capillary refill.  See picture in chart.  Neurological:     Mental Status: He is alert.     Comments: No facial droop.  Cranial 2 through 12 grossly intact.  Ambulatory in ED without difficulty.        ED Treatments / Results  Labs (all labs ordered are listed, but only abnormal results are displayed) Labs Reviewed - No data to display  EKG None  Radiology No results  found.  Procedures Procedures (including critical care time)  Medications Ordered in ED Medications - No data to display   Initial Impression / Assessment and Plan / ED Course  I have reviewed the triage vital signs and the nursing notes.  Pertinent labs & imaging results that were available during my care of the patient were reviewed by me and considered in my medical decision making (see chart for details).  53 year old male appears otherwise well presents for evaluation of  rash.  Afebrile, nonseptic, non-ill-appearing.  Rash noted to left flexural crease of upper extremity after gardening and yard.  He did note that there was poison ivy in yard however he is not sure if he came in contact with this.  He has no systemic symptoms.  He is afebrile without tachycardia, tachypnea or hypoxia.  Rashes nontender to palpation.  He has no fluctuance or induration.  No surrounding erythema, warmth or swelling.  He has a normal musculoskeletal exam.  He is neurovascularly intact.  No known insect bites, history of MRSA.  Rash not in dermatomal distribution.  Based off exam seems rash is consistent with dermatitis likely caused by poison ivy and will treat as such.  Discussed with patient we cannot rule out shingles however given his rash is nontender and not in a dermatomal distribution have low suspicion for this.  No evidence of drainable abscess.  He has full range of motion to his left elbow, no evidence of effusion I have low suspicion for gout, septic joint or hemarthrosis.  Compartments are soft.  Rash consistent with dermatitis. Patient denies any difficulty breathing or swallowing.  Pt has a patent airway without stridor and is handling secretions without difficulty; no angioedema. No blisters, no pustules, no warmth, no draining sinus tracts, no superficial abscesses, no bullous impetigo, no desquamation, no target lesions with dusky purpura or a central bulla. Not tender to touch. No concern for  superimposed infection. No concern for SJS, TEN, TSS, tick borne illness, syphilis or other life-threatening condition. Will discharge home with short course of steroids, and recommend Benadryl as needed for pruritis.  Discussed follow-up with PCP in 48 hours for wound recheck.  The patient has been appropriately medically screened and/or stabilized in the ED. I have low suspicion for any other emergent medical condition which would require further screening, evaluation or treatment in the ED or require inpatient management.  Patient is hemodynamically stable and in no acute distress.  Patient able to ambulate in department prior to ED.  Evaluation does not show acute pathology that would require ongoing or additional emergent interventions while in the emergency department or further inpatient treatment.  I have discussed the diagnosis with the patient and answered all questions.  Patient has no further complaints prior to discharge.  Patient is comfortable with plan discussed in room and is stable for discharge at this time.  I have discussed strict return precautions for returning to the emergency department.  Patient was encouraged to follow-up with PCP/specialist refer to at discharge.     Final Clinical Impressions(s) / ED Diagnoses   Final diagnoses:  Irritant contact dermatitis, unspecified trigger    ED Discharge Orders         Ordered    predniSONE (DELTASONE) 20 MG tablet  Daily     10/13/18 2104    loratadine (CLARITIN) 10 MG tablet  Daily     10/13/18 2104    hydrocortisone cream 1 %     10/13/18 2104           Harlen Danford A, PA-C 10/13/18 2109    Marcus Lyons, MD 10/13/18 2138

## 2018-10-13 NOTE — ED Triage Notes (Signed)
C/o rash to left arm x 2 days-NAD-steady gait

## 2018-10-13 NOTE — Discharge Instructions (Signed)
Evaluated today for rash.  This is likely related to poison ivy.  We cannot rule out a shingles infection however this is less likely.  If it spreads up your arm and is painful please seek reevaluation.  I have given you prescriptions to help with the itching.  Take as prescribed.  Follow up with PCP in 48 hours for wound recheck.

## 2019-09-18 IMAGING — CR DG CHEST 2V
2 series · 2 of 2 positions shown · non-contrast
Comparison: 09/03/2015

CLINICAL DATA: Cough, shortness of breath, and fever for 2 days.

EXAM:
CHEST - 2 VIEW

[w chest pa]
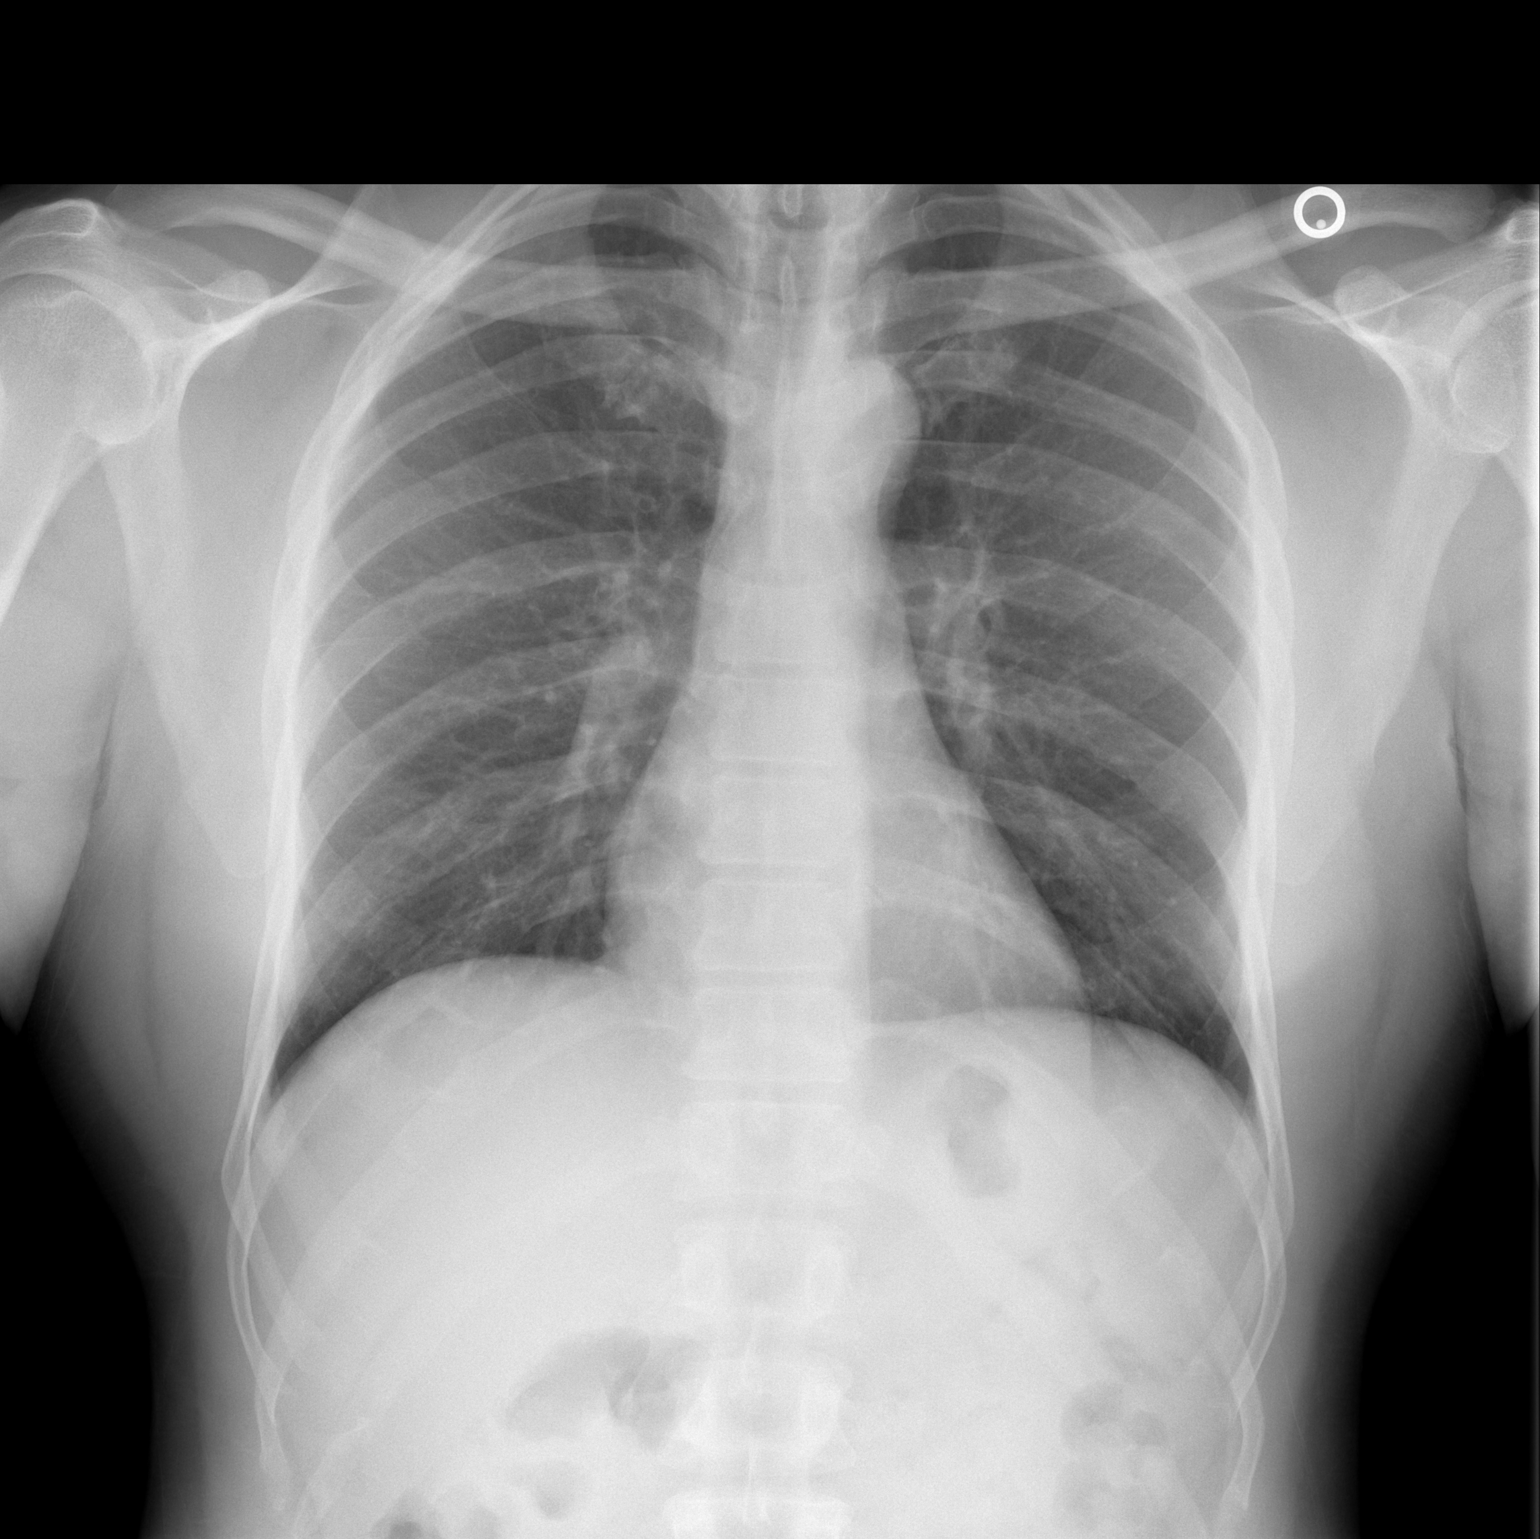

[w chest lat]
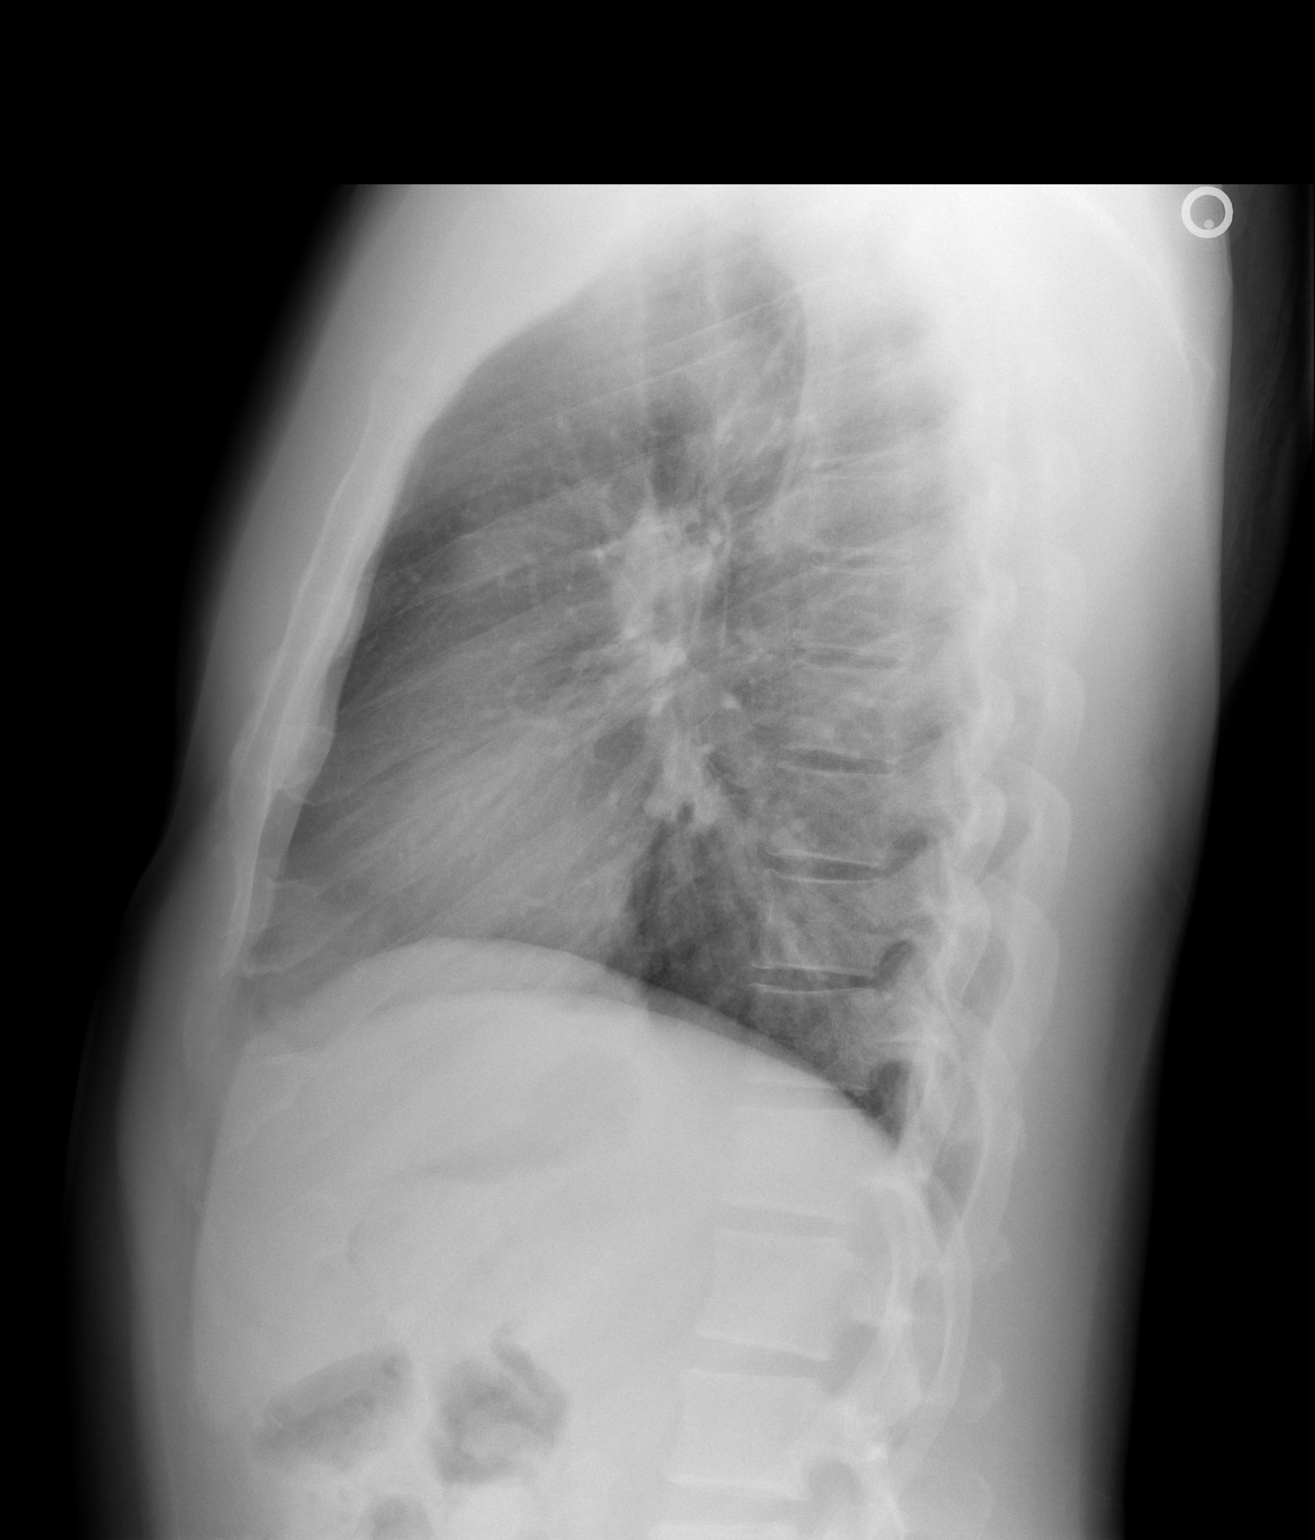

[2 of 2 positions shown; findings below may reference images not displayed]

FINDINGS: The heart size and mediastinal contours are within normal limits.
Both lungs are clear. The visualized skeletal structures are
unremarkable.
IMPRESSION: No active cardiopulmonary disease.

## 2020-02-10 ENCOUNTER — Emergency Department (HOSPITAL_BASED_OUTPATIENT_CLINIC_OR_DEPARTMENT_OTHER)
Admission: EM | Admit: 2020-02-10 | Discharge: 2020-02-10 | Disposition: A | Discharge status: Home / Self Care | Payer: Self-pay | Attending: Emergency Medicine | Admitting: Emergency Medicine

## 2020-02-10 ENCOUNTER — Encounter (HOSPITAL_BASED_OUTPATIENT_CLINIC_OR_DEPARTMENT_OTHER): Payer: Self-pay | Admitting: Emergency Medicine

## 2020-02-10 ENCOUNTER — Other Ambulatory Visit: Payer: Self-pay

## 2020-02-10 DIAGNOSIS — R350 Frequency of micturition: Secondary | ICD-10-CM | POA: Insufficient documentation

## 2020-02-10 DIAGNOSIS — Z7951 Long term (current) use of inhaled steroids: Secondary | ICD-10-CM | POA: Insufficient documentation

## 2020-02-10 DIAGNOSIS — Z79899 Other long term (current) drug therapy: Secondary | ICD-10-CM | POA: Insufficient documentation

## 2020-02-10 DIAGNOSIS — R300 Dysuria: Secondary | ICD-10-CM | POA: Insufficient documentation

## 2020-02-10 LAB — URINALYSIS, MICROSCOPIC (REFLEX): RBC / HPF: NONE SEEN RBC/hpf (ref 0–5)

## 2020-02-10 LAB — URINALYSIS, ROUTINE W REFLEX MICROSCOPIC
Bilirubin Urine: NEGATIVE
Glucose, UA: NEGATIVE mg/dL
Hgb urine dipstick: NEGATIVE
Ketones, ur: NEGATIVE mg/dL
Nitrite: NEGATIVE
Protein, ur: NEGATIVE mg/dL
Specific Gravity, Urine: 1.02 (ref 1.005–1.030)
pH: 6 (ref 5.0–8.0)

## 2020-02-10 MED ORDER — CEPHALEXIN 500 MG PO CAPS: 500.0000 mg | ORAL_CAPSULE | Freq: Two times a day (BID) | ORAL | 0 refills | Status: AC

## 2020-02-10 NOTE — ED Triage Notes (Addendum)
C/o urinary frequency "for the last couple of days". Denies hematuria, pain, fever or other sx. States he has had hx of same but was never told the cause. Denies excessive thirst, fatigue or concern for STD.

## 2020-02-10 NOTE — ED Provider Notes (Signed)
[facility] EMERGENCY DEPARTMENT Provider Note   CSN: [id number] Arrival date & time: 02/10/20  1930     History Chief Complaint  Patient presents with   Urinary Frequency    Marcus Young is a 54 y.o. male presented to emergency department with urinary frequency for the past week.  Patient reports this is similar episodes in the past, which is resolved after antibiotics.  He says his last episode was approximately 3 years ago, which corresponds in 2018 visit to the ED.  At that time he been treated with first Keflex and then doxycycline for UTI.  His UA showed nitrites, but his urine culture subsequently negative.  He does report improvement of his symptoms immediately after antibiotics.  Today comes back reporting urinary frequency and also some hesitation.  He denies penile drainage.  He denies fevers or chills.  He states his wife recently had a UTI a week ago and is treated for that, and wonders whether he may have gotten infection from her.  He adamantly denies concerns for chlamydia, gonorrhea, or any sexually transmitted diseases, and does not want to be tested for that at this time.  In the past he tested negative for this.  HPI     Past Medical History:  Diagnosis Date   Ulcerative colitis (HCC)     There are no problems to display for this patient.   Past Surgical History:  Procedure Laterality Date   ABDOMINAL SURGERY     COLONOSCOPY         No family history on file.  Social History   Tobacco Use   Smoking status: Never Smoker   Smokeless tobacco: Never Used  Vaping Use   Vaping Use: Never used  Substance Use Topics   Alcohol use: No   Drug use: No    Home Medications Prior to Admission medications   Medication Sig Start Date End Date Taking? Authorizing Provider  acetaminophen (TYLENOL) 325 MG tablet Take 2 tablets (650 mg total) by mouth every 6 (six) hours as needed. 06/08/18   Khatri, Hina, PA-C  benzonatate (TESSALON) 100 MG  capsule Take 2 capsules (200 mg total) by mouth every 8 (eight) hours. 06/08/18   Khatri, Hina, PA-C  cephALEXin (KEFLEX) 500 MG capsule Take 1 capsule (500 mg total) by mouth 2 (two) times daily for 5 days. 02/10/20 02/15/20  Terald Sleeper, MD  cetirizine (ZYRTEC) 5 MG tablet Take 1 tablet (5 mg total) by mouth daily. 06/08/18   Khatri, Hina, PA-C  fluticasone (FLONASE) 50 MCG/ACT nasal spray Place 1 spray into both nostrils daily. 06/08/18   Khatri, Hina, PA-C  HYDROcodone-acetaminophen (NORCO/VICODIN) 5-325 MG tablet Take 1 tablet by mouth every 4 (four) hours as needed. 12/16/17   Tegeler, Canary Brim, MD  hydrocortisone cream 1 % Apply to affected area 2 times daily 10/13/18   Henderly, Britni A, PA-C  loratadine (CLARITIN) 10 MG tablet Take 1 tablet (10 mg total) by mouth daily. 10/13/18   Henderly, Britni A, PA-C  Phenylephrine-Pheniramine-DM (THERAFLU COLD & COUGH) 02-28-19 MG PACK Take 1 packet by mouth.    [provider]    Allergies    Patient has no known allergies.  Review of Systems   Review of Systems  Constitutional: Negative for chills and fever.  HENT: Negative for ear pain and sore throat.   Eyes: Negative for pain and visual disturbance.  Respiratory: Negative for cough and shortness of breath.   Cardiovascular: Negative for chest pain and palpitations.  Gastrointestinal: Negative for abdominal pain and vomiting.  Genitourinary: Positive for difficulty urinating, dysuria and frequency. Negative for discharge, hematuria, penile pain, penile swelling, scrotal swelling and testicular pain.  Skin: Negative for rash and wound.  Neurological: Negative for seizures.  Psychiatric/Behavioral: Negative for agitation and confusion.  All other systems reviewed and are negative.   Physical Exam Updated Vital Signs BP 122/87 (BP Location: Right Arm)    Pulse 73    Temp 98 F (36.7 C) (Oral)    Resp 20    Ht 6' (1.829 m)    Wt 90.7 kg    SpO2 98%    BMI 27.12 kg/m    Physical Exam  ED Results / Procedures / Treatments   Labs (all labs ordered are listed, but only abnormal results are displayed) Labs Reviewed  URINALYSIS, ROUTINE W REFLEX MICROSCOPIC - Abnormal; Notable for the following components:      Result Value   Leukocytes,Ua SMALL (*)    All other components within normal limits  URINALYSIS, MICROSCOPIC (REFLEX) - Abnormal; Notable for the following components:   Bacteria, UA MANY (*)    All other components within normal limits  URINE CULTURE    EKG None  Radiology No results found.  Procedures Procedures (including critical care time)  Medications Ordered in ED Medications - No data to display  ED Course  I have reviewed the triage vital signs and the nursing notes.  Pertinent labs & imaging results that were available during my care of the patient were reviewed by me and considered in my medical decision making (see chart for details).  54 yo male presenting to ED with dysuria.  DDx includes cystitis vs ureteral colic vs other infection vs bladder spasm vs other  UA with leuks, many bacteria, minimal WBC, negative nitrites.   Patient reports in the past he's responded favorably to antibiotics.  I explained that I am not at all convinced these are recurring infections.  His prior urine cultures were negative.  However I think it's reasonable to treat a 5 day course of keflex.  If he continues having symptoms I encouraged him to f/u with a provider in our system to ensure his urine cx results prior to obtaining additional antibiotics.  I also strongly encouraged him to establish care with a urologist as I suspect there may be an alternative cause of dysuria.  I offered STI testing but he refused.  In the past this has been negative.  He has no fevers or penile drainage.     Final Clinical Impression(s) / ED Diagnoses Final diagnoses:  Dysuria    Rx / DC Orders ED Discharge Orders         Ordered    cephALEXin  (KEFLEX) 500 MG capsule  2 times daily        02/10/20 2145           Terald Sleeper, MD 02/11/20 [phone]

## 2020-02-10 NOTE — ED Notes (Signed)
Been having urinary frequency for the past several days, has been evaluated before with the same complaint, having multiple times during the day of voiding. Sometimes has some hesitation in starting his stream. States has normal amt of urine at each void. Denies fever, denies abd pain as well, does have hx of ulcerative colitis

## 2020-02-12 LAB — URINE CULTURE: Culture: NO GROWTH

## 2020-02-21 NOTE — ED Provider Notes (Signed)
Addendum for Physical exam  Physical Exam Constitutional:      General: He is not in acute distress.    Appearance: Normal appearance.  HENT:     Head: Normocephalic and atraumatic.     Nose: Nose normal.  Eyes:     Extraocular Movements: Extraocular movements intact.     Conjunctiva/sclera: Conjunctivae normal.  Cardiovascular:     Rate and Rhythm: Normal rate and regular rhythm.  Pulmonary:     Effort: Pulmonary effort is normal. No respiratory distress.  Skin:    General: Skin is warm and dry.  Neurological:     Mental Status: He is alert and oriented to person, place, and time. Mental status is at baseline.  Psychiatric:        Mood and Affect: Mood normal.        Behavior: Behavior normal.       Terald Sleeper, MD 02/21/20 [phone]

## 2020-09-05 ENCOUNTER — Emergency Department (HOSPITAL_BASED_OUTPATIENT_CLINIC_OR_DEPARTMENT_OTHER)
Admission: EM | Admit: 2020-09-05 | Discharge: 2020-09-05 | Disposition: A | Discharge status: Home / Self Care | Payer: Self-pay | Attending: Emergency Medicine | Admitting: Emergency Medicine

## 2020-09-05 ENCOUNTER — Other Ambulatory Visit: Payer: Self-pay

## 2020-09-05 ENCOUNTER — Encounter (HOSPITAL_BASED_OUTPATIENT_CLINIC_OR_DEPARTMENT_OTHER): Payer: Self-pay | Admitting: Emergency Medicine

## 2020-09-05 ENCOUNTER — Other Ambulatory Visit (HOSPITAL_BASED_OUTPATIENT_CLINIC_OR_DEPARTMENT_OTHER): Payer: Self-pay

## 2020-09-05 DIAGNOSIS — R350 Frequency of micturition: Secondary | ICD-10-CM

## 2020-09-05 DIAGNOSIS — N390 Urinary tract infection, site not specified: Secondary | ICD-10-CM | POA: Insufficient documentation

## 2020-09-05 DIAGNOSIS — R3915 Urgency of urination: Secondary | ICD-10-CM

## 2020-09-05 LAB — URINALYSIS, MICROSCOPIC (REFLEX)

## 2020-09-05 LAB — URINALYSIS, ROUTINE W REFLEX MICROSCOPIC
Bilirubin Urine: NEGATIVE
Glucose, UA: NEGATIVE mg/dL
Hgb urine dipstick: NEGATIVE
Ketones, ur: NEGATIVE mg/dL
Nitrite: NEGATIVE
Protein, ur: NEGATIVE mg/dL
Specific Gravity, Urine: 1.02 (ref 1.005–1.030)
pH: 6 (ref 5.0–8.0)

## 2020-09-05 MED ORDER — CEPHALEXIN 500 MG PO CAPS
500.0000 mg | ORAL_CAPSULE | ORAL | 0 refills | Status: DC
  Filled 2020-09-05: qty 40, 10d supply, fill #0

## 2020-09-05 MED ORDER — CEPHALEXIN 500 MG PO CAPS: 500.0000 mg | ORAL_CAPSULE | Freq: Four times a day (QID) | ORAL | 0 refills | Status: AC

## 2020-09-05 NOTE — ED Provider Notes (Signed)
[facility] EMERGENCY DEPARTMENT Provider Note   CSN: [id number] Arrival date & time: 09/05/20  4854     History Chief Complaint  Patient presents with  . Dysuria    Marcus Young is a 55 y.o. male.  The history is provided by the patient and medical records. No language interpreter was used.  Urinary Frequency This is a recurrent problem. The current episode started more than 2 days ago. The problem occurs constantly. The problem has not changed since onset.Pertinent negatives include no chest pain, no abdominal pain, no headaches and no shortness of breath. Nothing aggravates the symptoms. Nothing relieves the symptoms. He has tried nothing for the symptoms. The treatment provided no relief.       Past Medical History:  Diagnosis Date  . Ulcerative colitis (HCC)     There are no problems to display for this patient.   Past Surgical History:  Procedure Laterality Date  . ABDOMINAL SURGERY    . COLONOSCOPY         No family history on file.  Social History   Tobacco Use  . Smoking status: Never Smoker  . Smokeless tobacco: Never Used  Vaping Use  . Vaping Use: Never used  Substance Use Topics  . Alcohol use: No  . Drug use: No    Home Medications Prior to Admission medications   Medication Sig Start Date End Date Taking? Authorizing Provider  acetaminophen (TYLENOL) 325 MG tablet Take 2 tablets (650 mg total) by mouth every 6 (six) hours as needed. 06/08/18   Khatri, Hina, PA-C  benzonatate (TESSALON) 100 MG capsule Take 2 capsules (200 mg total) by mouth every 8 (eight) hours. 06/08/18   Khatri, Hina, PA-C  cetirizine (ZYRTEC) 5 MG tablet Take 1 tablet (5 mg total) by mouth daily. 06/08/18   Khatri, Hina, PA-C  fluticasone (FLONASE) 50 MCG/ACT nasal spray Place 1 spray into both nostrils daily. 06/08/18   Khatri, Hina, PA-C  HYDROcodone-acetaminophen (NORCO/VICODIN) 5-325 MG tablet Take 1 tablet by mouth every 4 (four) hours as needed. 12/16/17    Lionel Woodberry, Canary Brim, MD  hydrocortisone cream 1 % Apply to affected area 2 times daily 10/13/18   Henderly, Britni A, PA-C  loratadine (CLARITIN) 10 MG tablet Take 1 tablet (10 mg total) by mouth daily. 10/13/18   Henderly, Britni A, PA-C  Phenylephrine-Pheniramine-DM (THERAFLU COLD & COUGH) 02-28-19 MG PACK Take 1 packet by mouth.    [provider]    Allergies    Patient has no known allergies.  Review of Systems   Review of Systems  Constitutional: Negative for chills, fatigue and fever.  HENT: Negative for congestion.   Respiratory: Negative for shortness of breath.   Cardiovascular: Negative for chest pain.  Gastrointestinal: Negative for abdominal pain, constipation, diarrhea, nausea and vomiting.  Genitourinary: Positive for frequency and urgency. Negative for decreased urine volume, dysuria, flank pain, penile discharge, penile pain, penile swelling, scrotal swelling and testicular pain.  Musculoskeletal: Negative for back pain, neck pain and neck stiffness.  Skin: Negative for rash.  Neurological: Negative for light-headedness and headaches.  Psychiatric/Behavioral: Negative for agitation.    Physical Exam Updated Vital Signs BP 120/82 (BP Location: Right Arm)   Pulse 88   Temp 97.8 F (36.6 C) (Oral)   Resp 16   Ht 6' (1.829 m)   Wt 88 kg   SpO2 97%   BMI 26.31 kg/m   Physical Exam Vitals and nursing note reviewed.  Constitutional:  General: He is not in acute distress.    Appearance: He is well-developed. He is not ill-appearing, toxic-appearing or diaphoretic.  HENT:     Head: Normocephalic and atraumatic.     Nose: No congestion or rhinorrhea.     Mouth/Throat:     Mouth: Mucous membranes are moist.     Pharynx: No oropharyngeal exudate or posterior oropharyngeal erythema.  Eyes:     Extraocular Movements: Extraocular movements intact.     Conjunctiva/sclera: Conjunctivae normal.     Pupils: Pupils are equal, round, and reactive to light.   Cardiovascular:     Rate and Rhythm: Normal rate and regular rhythm.     Heart sounds: No murmur heard.   Pulmonary:     Effort: Pulmonary effort is normal. No respiratory distress.     Breath sounds: Normal breath sounds. No wheezing, rhonchi or rales.  Chest:     Chest wall: No tenderness.  Abdominal:     General: Abdomen is flat.     Palpations: Abdomen is soft.     Tenderness: There is no abdominal tenderness. There is no right CVA tenderness, left CVA tenderness, guarding or rebound.  Musculoskeletal:        General: No tenderness or signs of injury.     Cervical back: Neck supple.  Skin:    General: Skin is warm and dry.     Capillary Refill: Capillary refill takes less than 2 seconds.     Findings: No erythema or rash.  Neurological:     General: No focal deficit present.     Mental Status: He is alert.     Sensory: No sensory deficit.     Motor: No weakness.  Psychiatric:        Mood and Affect: Mood normal.     ED Results / Procedures / Treatments   Labs (all labs ordered are listed, but only abnormal results are displayed) Labs Reviewed  URINALYSIS, ROUTINE W REFLEX MICROSCOPIC - Abnormal; Notable for the following components:      Result Value   Leukocytes,Ua SMALL (*)    All other components within normal limits  URINALYSIS, MICROSCOPIC (REFLEX) - Abnormal; Notable for the following components:   Bacteria, UA RARE (*)    All other components within normal limits  URINE CULTURE    EKG None  Radiology No results found.  Procedures Procedures   Medications Ordered in ED Medications - No data to display  ED Course  I have reviewed the triage vital signs and the nursing notes.  Pertinent labs & imaging results that were available during my care of the patient were reviewed by me and considered in my medical decision making (see chart for details).    MDM Rules/Calculators/A&P                          [name] is a 55 y.o. male with a  past medical history significant ulcerative colitis and recurrent urinary tract infections who presents with urinary hesitancy, urgency, and frequency.  He denies dysuria, fevers, chills, discharge, or abnormalities of his groin.  He denies any abdominal pain, nausea, vomiting, constipation, diarrhea, or other abdominal symptoms.  He reports no new sexual partners and denies any concern for exposure to STI.  He denies any testicle or groin pain.  Denies any penile ulcers.  He reports he feels similar to when he had urinary tract infection several months ago.  He reports that he has  had a history of them over the years but is never seen a urologist or talked about PCP about this.  On exam, lungs clear and chest nontender.  Abdomen nontender.  GU exam was refused given lack of any penile symptoms.  He is more concerned about the urgency and hesitancy symptoms.  Other exam unremarkable.  Urinalysis was collected and does show leukocytes and bacteria with no evidence of contamination.  Although there is no nitrates, given his symptoms I do suspect he has a UTI.  We will get a culture sent and will give him prescription for antibiotics.  We also discussed getting STI labs but he reports he is not exposed any and does not feel he needs them.  With his lack of systemic symptoms otherwise, patient did not want other labs.  We did discuss that recurrent urinary tract infections can happen in some men and talked about interstitial cystitis.  Although I am not convinced he has this, he will follow-up with a PCP and a urologist to discuss further management of his recurrent UTI symptoms.  He does have ulcerative colitis but he denies abdominal pain.  Patient discharged home with plans for follow-up and return precautions and given prescription for antibiotics for UTI.   Final Clinical Impression(s) / ED Diagnoses Final diagnoses:  Lower urinary tract infectious disease  Urinary frequency  Urinary urgency     Clinical Impression: 1. Lower urinary tract infectious disease   2. Urinary frequency   3. Urinary urgency     Disposition: Discharge  Condition: Good  I have discussed the results, Dx and Tx plan with the pt(& family if present). He/she/they expressed understanding and agree(s) with the plan. Discharge instructions discussed at great length. Strict return precautions discussed and pt &/or family have verbalized understanding of the instructions. No further questions at time of discharge.    New Prescriptions   CEPHALEXIN (KEFLEX) 500 MG CAPSULE    Take 1 capsule (500 mg total) by mouth 4 (four) times daily for 10 days.    Follow Up: [facility] [address] [place] [address] [phone] Schedule an appointment as soon as possible for a visit    [facility] [address] [place] [address] [phone]         Maryssa Giampietro, Canary Brim, MD 09/05/20 1011

## 2020-09-05 NOTE — Discharge Instructions (Signed)
Your history, exam, and laboratory work-up today are consistent with urinary tract infection recurrence.  Given your otherwise well appearance and lack of pain, we agreed to hold on extensive labs or imaging however I am concerned about her recurrent UTIs.  Please follow-up with a primary doctor as well as urologist for further evaluation and management.  Please take the antibiotics and maintain hydration as this will likely help as well.  If any symptoms change acutely or worsen, please return to the nearest emergency department.

## 2020-09-05 NOTE — ED Triage Notes (Signed)
Pt having urinary frequency for 4 days.  Pt states he has a hard time with starting occasionally.  No burning, no urgency at the end of urination.  No known fevers at home.  No new back pain.

## 2020-09-07 LAB — URINE CULTURE: Culture: NO GROWTH

## 2022-01-01 ENCOUNTER — Other Ambulatory Visit: Payer: Self-pay

## 2022-01-01 ENCOUNTER — Other Ambulatory Visit (HOSPITAL_BASED_OUTPATIENT_CLINIC_OR_DEPARTMENT_OTHER): Payer: Self-pay

## 2022-01-01 ENCOUNTER — Encounter (HOSPITAL_BASED_OUTPATIENT_CLINIC_OR_DEPARTMENT_OTHER): Payer: Self-pay

## 2022-01-01 ENCOUNTER — Emergency Department (HOSPITAL_BASED_OUTPATIENT_CLINIC_OR_DEPARTMENT_OTHER)
Admission: EM | Admit: 2022-01-01 | Discharge: 2022-01-01 | Disposition: A | Discharge status: Home / Self Care | Payer: Self-pay | Attending: Emergency Medicine | Admitting: Emergency Medicine

## 2022-01-01 DIAGNOSIS — N3000 Acute cystitis without hematuria: Secondary | ICD-10-CM | POA: Insufficient documentation

## 2022-01-01 LAB — URINALYSIS, ROUTINE W REFLEX MICROSCOPIC
Bilirubin Urine: NEGATIVE
Glucose, UA: NEGATIVE mg/dL
Hgb urine dipstick: NEGATIVE
Ketones, ur: NEGATIVE mg/dL
Nitrite: NEGATIVE
Protein, ur: NEGATIVE mg/dL
Specific Gravity, Urine: 1.02 (ref 1.005–1.030)
pH: 5.5 (ref 5.0–8.0)

## 2022-01-01 LAB — URINALYSIS, MICROSCOPIC (REFLEX)

## 2022-01-01 LAB — CBG MONITORING, ED: Glucose-Capillary: 102 mg/dL — ABNORMAL HIGH (ref 70–99)

## 2022-01-01 MED ORDER — CEPHALEXIN 500 MG PO CAPS
500.0000 mg | ORAL_CAPSULE | Freq: Four times a day (QID) | ORAL | 0 refills | Status: DC
  Filled 2022-01-01: qty 20, 5d supply, fill #0

## 2022-01-01 MED ORDER — CEFTRIAXONE SODIUM 1 G IJ SOLR
1.0000 g | Freq: Once | INTRAMUSCULAR | Status: AC
  Administered 2022-01-01: 1 g via INTRAMUSCULAR
  Filled 2022-01-01: qty 10

## 2022-01-01 NOTE — ED Provider Notes (Signed)
[facility] EMERGENCY DEPARTMENT Provider Note   CSN: [id number] Arrival date & time: 01/01/22  1234     History  Chief Complaint  Patient presents with   Urinary Frequency    Marcus Young is a 56 y.o. male.   Urinary Frequency   56 year old male presents emergency department with complaints of urinary frequency/penile itching.  He states that the symptoms are present for the past 3 to 4 days.  He notes similar symptoms in the past when he had urinary tract infections.  He denies fever, low back pain, abdominal pain, nausea, vomiting, concern for STDs, change in bowel habits, rash in groin area, penile discharge.  Past medical history significant for ulcerative colitis.  Home Medications Prior to Admission medications   Medication Sig Start Date End Date Taking? Authorizing Provider  cephALEXin (KEFLEX) 500 MG capsule Take 1 capsule (500 mg total) by mouth 4 (four) times daily. 01/01/22  Yes Sherian Maroon A, PA  acetaminophen (TYLENOL) 325 MG tablet Take 2 tablets (650 mg total) by mouth every 6 (six) hours as needed. 06/08/18   Khatri, Hina, PA-C  benzonatate (TESSALON) 100 MG capsule Take 2 capsules (200 mg total) by mouth every 8 (eight) hours. 06/08/18   Khatri, Hina, PA-C  cetirizine (ZYRTEC) 5 MG tablet Take 1 tablet (5 mg total) by mouth daily. 06/08/18   Khatri, Hina, PA-C  fluticasone (FLONASE) 50 MCG/ACT nasal spray Place 1 spray into both nostrils daily. 06/08/18   Khatri, Hina, PA-C  HYDROcodone-acetaminophen (NORCO/VICODIN) 5-325 MG tablet Take 1 tablet by mouth every 4 (four) hours as needed. 12/16/17   Tegeler, Canary Brim, MD  hydrocortisone cream 1 % Apply to affected area 2 times daily 10/13/18   Henderly, Britni A, PA-C  loratadine (CLARITIN) 10 MG tablet Take 1 tablet (10 mg total) by mouth daily. 10/13/18   Henderly, Britni A, PA-C  Phenylephrine-Pheniramine-DM (THERAFLU COLD & COUGH) 02-28-19 MG PACK Take 1 packet by mouth.    [provider]       Allergies    Patient has no known allergies.    Review of Systems   Review of Systems  Genitourinary:  Positive for frequency.    Physical Exam Updated Vital Signs BP 118/82 (BP Location: Right Arm)   Pulse 83   Temp 98.7 F (37.1 C) (Oral)   Resp 18   Ht 6' (1.829 m)   Wt 88.5 kg   SpO2 95%   BMI 26.45 kg/m  Physical Exam Vitals and nursing note reviewed.  Constitutional:      General: He is not in acute distress.    Appearance: He is well-developed.  HENT:     Head: Normocephalic and atraumatic.  Eyes:     Conjunctiva/sclera: Conjunctivae normal.  Cardiovascular:     Rate and Rhythm: Normal rate and regular rhythm.     Heart sounds: No murmur heard. Pulmonary:     Effort: Pulmonary effort is normal. No respiratory distress.     Breath sounds: Normal breath sounds.  Abdominal:     Palpations: Abdomen is soft.     Tenderness: There is no abdominal tenderness.  Musculoskeletal:        General: No swelling.     Cervical back: Neck supple.  Skin:    General: Skin is warm and dry.     Capillary Refill: Capillary refill takes less than 2 seconds.  Neurological:     Mental Status: He is alert.  Psychiatric:  Mood and Affect: Mood normal.     ED Results / Procedures / Treatments   Labs (all labs ordered are listed, but only abnormal results are displayed) Labs Reviewed  URINALYSIS, ROUTINE W REFLEX MICROSCOPIC - Abnormal; Notable for the following components:      Result Value   Leukocytes,Ua TRACE (*)    All other components within normal limits  URINALYSIS, MICROSCOPIC (REFLEX) - Abnormal; Notable for the following components:   Bacteria, UA RARE (*)    Non Squamous Epithelial PRESENT (*)    All other components within normal limits  CBG MONITORING, ED - Abnormal; Notable for the following components:   Glucose-Capillary 102 (*)    All other components within normal limits  URINE CULTURE    EKG None  Radiology No results  found.  Procedures Procedures    Medications Ordered in ED Medications  cefTRIAXone (ROCEPHIN) injection 1 g (has no administration in time range)    ED Course/ Medical Decision Making/ A&P                           Medical Decision Making Amount and/or Complexity of Data Reviewed Labs: ordered.   This patient presents to the ED for concern of urinary frequency/itching, this involves an extensive number of treatment options, and is a complaint that carries with it a high risk of complications and morbidity.  The differential diagnosis includes urinary tract infection, gonorrhea/chlamydia, syphilis, laceration/abrasion, pyelonephritis, sepsis,   Co morbidities that complicate the patient evaluation  See HPI   Additional history obtained:  Additional history obtained from EMR External records from outside source obtained and reviewed including medication prescribed upon past emergency department visit for urinary tract infection   Lab Tests:  I Ordered, and personally interpreted labs.  The pertinent results include: CBG of 102.  Urine culture pending.  Urinalysis significant for trace leukocyte, rare bacteria and 0-5 squamous cells.  Mild squamous cells present.  Concern for urinary tract infection.     Imaging Studies ordered:  N/a   Cardiac Monitoring: / EKG:  The patient was maintained on a cardiac monitor.  I personally viewed and interpreted the cardiac monitored which showed an underlying rhythm of: Sinus rhythm   Consultations Obtained:  N/a   Problem List / ED Course / Critical interventions / Medication management  Urinary tract infection I ordered medication including Rocephin for antibiotic coverage   Reevaluation of the patient after these medicines showed that the patient improved I have reviewed the patients home medicines and have made adjustments as needed   Social Determinants of Health:  Denies tobacco, illicit drug use   Test /  Admission - Considered:  Urinary tract infection Vitals signs ]within normal range and stable throughout visit. Laboratory studies significant for: See above Given patient's symptoms as well as UA findings, patient symptoms most likely consistent with urinary tract infection without hematuria.  Doubt pyelonephritis.  Doubt sepsis.  Patient will be treated with empiric antibiotic coverage of 1 g of Rocephin while in emergency department with outpatient Keflex.  Close follow-up with urology recommended given patient's yearly recurrence of urinary tract infections.  Treatment plan was discussed at length with patient and he knowledge understanding was agreeable to said plan. Worrisome signs and symptoms were discussed with the patient, and the patient acknowledged understanding to return to the ED if noticed. Patient was stable upon discharge.         Final Clinical Impression(s) / ED  Diagnoses Final diagnoses:  Acute cystitis without hematuria    Rx / DC Orders ED Discharge Orders          Ordered    cephALEXin (KEFLEX) 500 MG capsule  4 times daily        01/01/22 1436              Peter Garter, [place] 01/01/22 1437    Mardene Sayer, MD 01/01/22 1746

## 2022-01-01 NOTE — ED Triage Notes (Signed)
C/o frequent urination the past few days. Denies abdominal pain. "Itchy" feeling when he urinates.

## 2022-01-01 NOTE — ED Notes (Signed)
Called lab to add urine culture onto previously collected urine 

## 2022-01-01 NOTE — Discharge Instructions (Addendum)
Note the work-up today was overall consistent with urinary tract infection.  We will treat this with 1 shot of antibiotic while in emergency department as well as 5-day course of antibiotics outpatient.  I have attached information to set up a primary care provider through the [facility] health and wellness number attached your discharge papers.  There is also a number for urology here in [facility] to call to set up an appointment.  Please not hesitate to return to the emergency department if the worrisome signs and symptoms we discussed become apparent.

## 2022-01-02 LAB — URINE CULTURE: Culture: NO GROWTH

## 2023-02-18 ENCOUNTER — Other Ambulatory Visit (HOSPITAL_COMMUNITY): Payer: Self-pay

## 2023-03-31 ENCOUNTER — Emergency Department (HOSPITAL_BASED_OUTPATIENT_CLINIC_OR_DEPARTMENT_OTHER)
Admission: EM | Admit: 2023-03-31 | Discharge: 2023-03-31 | Disposition: A | Discharge status: Home / Self Care | Payer: BLUE CROSS/BLUE SHIELD

## 2023-03-31 ENCOUNTER — Encounter (HOSPITAL_BASED_OUTPATIENT_CLINIC_OR_DEPARTMENT_OTHER): Payer: Self-pay

## 2023-03-31 ENCOUNTER — Other Ambulatory Visit (HOSPITAL_BASED_OUTPATIENT_CLINIC_OR_DEPARTMENT_OTHER): Payer: Self-pay

## 2023-03-31 ENCOUNTER — Other Ambulatory Visit: Payer: Self-pay

## 2023-03-31 DIAGNOSIS — R350 Frequency of micturition: Secondary | ICD-10-CM | POA: Insufficient documentation

## 2023-03-31 LAB — URINALYSIS, MICROSCOPIC (REFLEX): RBC / HPF: NONE SEEN RBC/hpf (ref 0–5)

## 2023-03-31 LAB — URINALYSIS, ROUTINE W REFLEX MICROSCOPIC
Bilirubin Urine: NEGATIVE
Glucose, UA: NEGATIVE mg/dL
Hgb urine dipstick: NEGATIVE
Ketones, ur: NEGATIVE mg/dL
Nitrite: NEGATIVE
Protein, ur: NEGATIVE mg/dL
Specific Gravity, Urine: 1.02 (ref 1.005–1.030)
pH: 6 (ref 5.0–8.0)

## 2023-03-31 LAB — CBG MONITORING, ED: Glucose-Capillary: 76 mg/dL (ref 70–99)

## 2023-03-31 MED ORDER — CEFADROXIL 500 MG PO CAPS
500.0000 mg | ORAL_CAPSULE | Freq: Two times a day (BID) | ORAL | 0 refills | Status: DC
  Filled 2023-03-31: qty 12, 6d supply, fill #0

## 2023-03-31 NOTE — ED Provider Notes (Signed)
[place] [facility] Provider Note   CSN: [id number] Arrival date & time: 03/31/23  1355     History  Chief Complaint  Patient presents with   Urinary Frequency    Marcus Young is a 57 y.o. male.   Urinary Frequency   57 year old male presents emergency department complaints of urinary frequency.  Patient states that he has been having symptoms since Sunday.  States he has been seen multiple times for this over the past several years but has not gone down to the "root cause of it."  Denies any fever, abdominal pain, penile discharge.  Reports sexual activity with the same partner for the past 4 to 5 months and is not concerned about STDs and declining testing.  Denies any hematuria, dysuria.  No significant pertinent past medical history.  Home Medications Prior to Admission medications   Medication Sig Start Date End Date Taking? Authorizing Provider  cefadroxil (DURICEF) 500 MG capsule Take 1 capsule (500 mg total) by mouth 2 (two) times daily. 03/31/23  Yes Sherian Maroon A, PA  acetaminophen (TYLENOL) 325 MG tablet Take 2 tablets (650 mg total) by mouth every 6 (six) hours as needed. 06/08/18   Khatri, Hina, PA-C  benzonatate (TESSALON) 100 MG capsule Take 2 capsules (200 mg total) by mouth every 8 (eight) hours. 06/08/18   Khatri, Hina, PA-C  cetirizine (ZYRTEC) 5 MG tablet Take 1 tablet (5 mg total) by mouth daily. 06/08/18   Khatri, Hina, PA-C  fluticasone (FLONASE) 50 MCG/ACT nasal spray Place 1 spray into both nostrils daily. 06/08/18   Khatri, Hina, PA-C  HYDROcodone-acetaminophen (NORCO/VICODIN) 5-325 MG tablet Take 1 tablet by mouth every 4 (four) hours as needed. 12/16/17   Tegeler, Canary Brim, MD  hydrocortisone cream 1 % Apply to affected area 2 times daily 10/13/18   Henderly, Britni A, PA-C  loratadine (CLARITIN) 10 MG tablet Take 1 tablet (10 mg total) by mouth daily. 10/13/18   Henderly, Britni A, PA-C   Phenylephrine-Pheniramine-DM (THERAFLU COLD & COUGH) 02-28-19 MG PACK Take 1 packet by mouth.    [provider]      Allergies    Patient has no known allergies.    Review of Systems   Review of Systems  Genitourinary:  Positive for frequency.  All other systems reviewed and are negative.   Physical Exam Updated Vital Signs BP 122/74 (BP Location: Right Arm)   Pulse 83   Temp 97.8 F (36.6 C)   Resp 18   Ht 6' (1.829 m)   Wt 90.7 kg   SpO2 98%   BMI 27.12 kg/m  Physical Exam Vitals and nursing note reviewed.  Constitutional:      General: He is not in acute distress.    Appearance: He is well-developed.  HENT:     Head: Normocephalic and atraumatic.  Eyes:     Conjunctiva/sclera: Conjunctivae normal.  Cardiovascular:     Rate and Rhythm: Normal rate and regular rhythm.     Heart sounds: No murmur heard. Pulmonary:     Effort: Pulmonary effort is normal. No respiratory distress.     Breath sounds: Normal breath sounds.  Abdominal:     Palpations: Abdomen is soft.     Tenderness: There is no abdominal tenderness. There is no guarding.  Musculoskeletal:        General: No swelling.     Cervical back: Neck supple.  Skin:    General: Skin is warm and dry.  Capillary Refill: Capillary refill takes less than 2 seconds.  Neurological:     Mental Status: He is alert.  Psychiatric:        Mood and Affect: Mood normal.     ED Results / Procedures / Treatments   Labs (all labs ordered are listed, but only abnormal results are displayed) Labs Reviewed  URINALYSIS, ROUTINE W REFLEX MICROSCOPIC - Abnormal; Notable for the following components:      Result Value   Leukocytes,Ua TRACE (*)    All other components within normal limits  URINALYSIS, MICROSCOPIC (REFLEX) - Abnormal; Notable for the following components:   Bacteria, UA RARE (*)    All other components within normal limits  URINE CULTURE  CBG MONITORING, ED    EKG None  Radiology No  results found.  Procedures Procedures    Medications Ordered in ED Medications - No data to display  ED Course/ Medical Decision Making/ A&P                                 Medical Decision Making Amount and/or Complexity of Data Reviewed Labs: ordered.  Risk Prescription drug management.   This patient presents to the ED for concern of urinary frequency, this involves an extensive number of treatment options, and is a complaint that carries with it a high risk of complications and morbidity.  The differential diagnosis includes UTI, pyelonephritis, DKA/HHS, hyperglycemia   Co morbidities that complicate the patient evaluation  See HPI   Additional history obtained:  Additional history obtained from EMR External records from outside source obtained and reviewed including hospital records   Lab Tests:  I Ordered, and personally interpreted labs.  The pertinent results include: UA with rare bacteria, trace leukocytes but otherwise unremarkable.  CBG negative.   Imaging Studies ordered:  N/a   Cardiac Monitoring: / EKG:  The patient was maintained on a cardiac monitor.  I personally viewed and interpreted the cardiac monitored which showed an underlying rhythm of: Sinus rhythm   Consultations Obtained:  N/a   Problem List / ED Course / Critical interventions / Medication management  Urinary frequency Reevaluation of the patient showed that the patient stayed the same I have reviewed the patients home medicines and have made adjustments as needed   Social Determinants of Health:  Denies tobacco, illicit drug use   Test / Admission - Considered:  Urinary frequency Vitals signs within normal range and stable throughout visit. Laboratory studies significant for: See above 56 year old male presents emergency department with complaints of urinary frequency since Sunday.  Patient with history of similar occurrences in the past that seemed to have better  with antibiotics.  Patient without fever, abdominal tenderness, CVA tenderness on exam.  UA concerning for trace leukocytes, hematuria 0-5 WBCs.  Given patient's symptomatic nature, will empirically treat for UTI as well as culture urine for assessment of potential bacterial pathogen.  Will recommend outpatient follow-up with urology given patient's intermittent symptoms of urinary frequency.  Treatment plan discussed with patient and he acknowledged understanding was agreeable to said plan.  Patient overall well-appearing, afebrile in no acute distress. Worrisome signs and symptoms were discussed with the patient, and the patient acknowledged understanding to return to the ED if noticed. Patient was stable upon discharge.          Final Clinical Impression(s) / ED Diagnoses Final diagnoses:  Urinary frequency    Rx / DC Orders ED  Discharge Orders          Ordered    Ambulatory referral to Urology        03/31/23 1643    cefadroxil (DURICEF) 500 MG capsule  2 times daily        03/31/23 1644              Peter Garter, [place] 03/31/23 2209    Durwin Glaze, MD 04/01/23 [phone]

## 2023-03-31 NOTE — ED Triage Notes (Signed)
In for eval of urinary frequency and urgency since Sunday. Denies dysuria or abd pain. Denies thirst. Sexually active with same partner for 4-5 months.

## 2023-03-31 NOTE — ED Notes (Signed)

## 2023-04-01 LAB — URINE CULTURE: Culture: <10000 — AB

## 2023-04-27 ENCOUNTER — Encounter: Payer: Self-pay | Admitting: Urology

## 2023-04-27 ENCOUNTER — Ambulatory Visit: Payer: BLUE CROSS/BLUE SHIELD | Admitting: Urology

## 2023-04-27 ENCOUNTER — Telehealth: Payer: Self-pay | Admitting: Urology

## 2023-04-27 VITALS — BP 122/76 | HR 73 | Ht 72.0 in | Wt 200.0 lb

## 2023-04-27 DIAGNOSIS — R350 Frequency of micturition: Secondary | ICD-10-CM

## 2023-04-27 LAB — BLADDER SCAN AMB NON-IMAGING

## 2023-04-27 NOTE — Progress Notes (Addendum)
Assessment: 1. Urinary frequency      Plan: Today I had a long discussion with the patient regarding his intermittent flareups of urinary frequency and urgency.  At present he does not feel like his symptoms warrant prescription medication.  I discussed with him proper fluid intake and to try to stay hydrated and drink more water which could certainly help.  Today also discussed with him prostate cancer screening.  He has had no prior PSA testing.  He would like to begin testing and will continue with his PCP. PSA today  Chief Complaint:   History of Present Illness:  Marcus Young is a 57 y.o. male who is seen today for evaluation of lower urinary tract symptoms.  Patient was seen in the emergency room last month with worsening urinary frequency and urgency.  He was seen in emergency room also a year and a half ago for similar complaints.  These episodes have been quite intermittent.  Urinalysis and culture were negative.  He was given antibiotic empirically.  His frequency went away after approximately 2 days and he is now back to baseline and reports very mild to moderate lower urinary tract symptoms.    Current IPSS = 10/1.   Pvr 79  Patient does not think his current degree of symptoms warrant prescription medication.  In trying to sort out why he would have these episodes I question whether he was taking over-the-counter cold medicines with phenylephrine and he denies that.  He does admit to drinking very little water.  He drinks a lot of citrus juices and ginger ale.  No family history of prostate cancer.  No known prior PSA testing.  Past Medical History:  No past medical history on file.  Past Surgical History:  Past Surgical History:  Procedure Laterality Date   COLONOSCOPY      Allergies:  No Known Allergies  Family History:  No family history on file.  Social History:  Social History   Tobacco Use   Smoking status: Never   Smokeless tobacco: Never   Vaping Use   Vaping status: Never Used  Substance Use Topics   Alcohol use: No   Drug use: No    Review of symptoms:  Constitutional:  Negative for unexplained weight loss, night sweats, fever, chills ENT:  Negative for nose bleeds, sinus pain, painful swallowing CV:  Negative for chest pain, shortness of breath, exercise intolerance, palpitations, loss of consciousness Resp:  Negative for cough, wheezing, shortness of breath GI:  Negative for nausea, vomiting, diarrhea, bloody stools GU:  Positives noted in HPI; otherwise negative for gross hematuria, dysuria, urinary incontinence Neuro:  Negative for seizures, poor balance, limb weakness, slurred speech Psych:  Negative for lack of energy, depression, anxiety Endocrine:  Negative for polydipsia, polyuria, symptoms of hypoglycemia (dizziness, hunger, sweating) Hematologic:  Negative for anemia, purpura, petechia, prolonged or excessive bleeding, use of anticoagulants  Allergic:  Negative for difficulty breathing or choking as a result of exposure to anything; no shellfish allergy; no allergic response (rash/itch) to materials, foods  Physical exam: BP 122/76   Pulse 73   Ht 6' (1.829 m)   Wt 200 lb (90.7 kg)   BMI 27.12 kg/m  GENERAL APPEARANCE:  Well appearing, well developed, well nourished, NAD  GU: Normal external genitalia DRE: Normal sphincter tone; prostate is approximately 30 g without nodules or induration.  Results: Results for orders placed or performed in visit on 04/27/23 (from the past 24 hours)  BLADDER SCAN AMB NON-IMAGING  Collection Time: 04/27/23 12:00 AM  Result Value Ref Range   Scan Result 79ml

## 2023-04-27 NOTE — Telephone Encounter (Signed)
Pt was told year ago that he has ulcerative colitis. Had a complete physical this year at his doctors and was told that there were no signs of this. Pt would like a referral or recommendation for a doctor that deals with ulcerative colitis.

## 2023-04-28 LAB — PSA: Prostate Specific Ag, Serum: 0.8 ng/mL (ref 0.0–4.0)

## 2023-06-15 ENCOUNTER — Encounter (HOSPITAL_BASED_OUTPATIENT_CLINIC_OR_DEPARTMENT_OTHER): Payer: Self-pay | Admitting: Emergency Medicine

## 2023-06-15 ENCOUNTER — Emergency Department (HOSPITAL_BASED_OUTPATIENT_CLINIC_OR_DEPARTMENT_OTHER)
Admission: EM | Admit: 2023-06-15 | Discharge: 2023-06-15 | Disposition: A | Discharge status: Home / Self Care | Payer: Self-pay | Attending: Emergency Medicine | Admitting: Emergency Medicine

## 2023-06-15 ENCOUNTER — Other Ambulatory Visit: Payer: Self-pay

## 2023-06-15 DIAGNOSIS — H60501 Unspecified acute noninfective otitis externa, right ear: Secondary | ICD-10-CM | POA: Insufficient documentation

## 2023-06-15 DIAGNOSIS — H6121 Impacted cerumen, right ear: Secondary | ICD-10-CM | POA: Insufficient documentation

## 2023-06-15 DIAGNOSIS — H6120 Impacted cerumen, unspecified ear: Secondary | ICD-10-CM

## 2023-06-15 MED ORDER — CARBAMIDE PEROXIDE 6.5 % OT SOLN: 5.0000 [drp] | Freq: Two times a day (BID) | OTIC | 0 refills | Status: DC

## 2023-06-15 MED ORDER — CIPROFLOXACIN-DEXAMETHASONE 0.3-0.1 % OT SUSP: 4.0000 [drp] | Freq: Two times a day (BID) | OTIC | 0 refills | Status: DC

## 2023-06-15 NOTE — ED Triage Notes (Signed)
Rt ear feel full and states cannot hear well x 2 days no pain

## 2023-06-15 NOTE — ED Provider Notes (Signed)
 [place] [facility] Provider Note   CSN: [id number] Arrival date & time: 06/15/23  1816     History Chief Complaint  Patient presents with   Hearing Problem    HPI Marcus Young is a 58 y.o. male presenting for right ear pain. Denies fevers or chills/N/V/D syncope SOB  Patient's recorded medical, surgical, social, medication list and allergies were reviewed in the Snapshot window as part of the initial history.   Review of Systems   Review of Systems  Constitutional:  Negative for chills and fever.  HENT:  Positive for ear pain. Negative for sore throat.   Eyes:  Negative for pain and visual disturbance.  Respiratory:  Negative for cough and shortness of breath.   Cardiovascular:  Negative for chest pain and palpitations.  Gastrointestinal:  Negative for abdominal pain and vomiting.  Genitourinary:  Negative for dysuria and hematuria.  Musculoskeletal:  Negative for arthralgias and back pain.  Skin:  Negative for color change and rash.  Neurological:  Negative for seizures and syncope.  All other systems reviewed and are negative.   Physical Exam Updated Vital Signs BP 131/74 (BP Location: Left Arm)   Pulse 81   Temp 97.7 F (36.5 C)   Resp 18   Ht 6' (1.829 m)   Wt 88.5 kg   SpO2 94%   BMI 26.45 kg/m  Physical Exam Vitals and nursing note reviewed.  Constitutional:      General: He is not in acute distress.    Appearance: He is well-developed.  HENT:     Head: Normocephalic and atraumatic.  Eyes:     Conjunctiva/sclera: Conjunctivae normal.  Cardiovascular:     Rate and Rhythm: Normal rate and regular rhythm.     Heart sounds: No murmur heard. Pulmonary:     Effort: Pulmonary effort is normal. No respiratory distress.     Breath sounds: Normal breath sounds.  Abdominal:     Palpations: Abdomen is soft.     Tenderness: There is no abdominal tenderness.  Musculoskeletal:        General: No swelling.     Cervical  back: Neck supple.  Skin:    General: Skin is warm and dry.     Capillary Refill: Capillary refill takes less than 2 seconds.  Neurological:     Mental Status: He is alert.  Psychiatric:        Mood and Affect: Mood normal.      ED Course/ Medical Decision Making/ A&P    Procedures Ear Cerumen Removal  Date/Time: 06/15/2023 7:00 PM  Performed by: Jerral Meth, MD Authorized by: Jerral Meth, MD   Consent:    Consent obtained:  Verbal   Consent given by:  Patient   Risks, benefits, and alternatives were discussed: yes     Risks discussed:  Bleeding, infection, incomplete removal and dizziness   Alternatives discussed:  No treatment Procedure details:    Location:  R ear   Procedure type: irrigation     Procedure outcomes: cerumen removed   Post-procedure details:    Inspection:  TM intact and no bleeding   Hearing quality:  Improved   Procedure completion:  Tolerated    Medications Ordered in ED Medications - No data to display  Medical Decision Making:   58 year old male well-appearing.  Exam revealed a right sided earwax obstruction.  Relieved as above.  All symptoms completely resolved.  He does have substantial mount of exterior tenderness to exam and  erythema. Concern for possible otitis externa due to the obstruction.  Will treat with ciprofloxacin  drops and recommend follow-up with PCP.  Debrox drops recommended to the patient to prevent recurrent obstruction as well.  Clinical Impression:  1. Acute otitis externa of right ear, unspecified type   2. Wax in ear      Data Unavailable   Final Clinical Impression(s) / ED Diagnoses Final diagnoses:  Acute otitis externa of right ear, unspecified type  Wax in ear    Rx / DC Orders ED Discharge Orders          Ordered    carbamide peroxide (DEBROX) 6.5 % OTIC solution  2 times daily        06/15/23 1859    ciprofloxacin -dexamethasone  (CIPRODEX ) OTIC suspension  2 times daily        06/15/23 1859               Jerral Meth, MD 06/15/23 1900

## 2023-06-15 NOTE — ED Notes (Signed)

## 2023-10-16 ENCOUNTER — Emergency Department (HOSPITAL_BASED_OUTPATIENT_CLINIC_OR_DEPARTMENT_OTHER): Admission: EM | Admit: 2023-10-16 | Discharge: 2023-10-16 | Disposition: A | Discharge status: Home / Self Care

## 2023-10-16 ENCOUNTER — Other Ambulatory Visit: Payer: Self-pay

## 2023-10-16 ENCOUNTER — Encounter (HOSPITAL_BASED_OUTPATIENT_CLINIC_OR_DEPARTMENT_OTHER): Payer: Self-pay | Admitting: Emergency Medicine

## 2023-10-16 DIAGNOSIS — R300 Dysuria: Secondary | ICD-10-CM | POA: Diagnosis present

## 2023-10-16 DIAGNOSIS — R350 Frequency of micturition: Secondary | ICD-10-CM | POA: Insufficient documentation

## 2023-10-16 LAB — URINALYSIS, ROUTINE W REFLEX MICROSCOPIC
Bilirubin Urine: NEGATIVE
Glucose, UA: NEGATIVE mg/dL
Hgb urine dipstick: NEGATIVE
Ketones, ur: NEGATIVE mg/dL
Nitrite: NEGATIVE
Protein, ur: NEGATIVE mg/dL
Specific Gravity, Urine: 1.02 (ref 1.005–1.030)
pH: 5.5 (ref 5.0–8.0)

## 2023-10-16 LAB — URINALYSIS, MICROSCOPIC (REFLEX)

## 2023-10-16 LAB — CBG MONITORING, ED: Glucose-Capillary: 111 mg/dL — ABNORMAL HIGH (ref 70–99)

## 2023-10-16 MED ORDER — CEFADROXIL 500 MG PO CAPS: 500.0000 mg | ORAL_CAPSULE | Freq: Two times a day (BID) | ORAL | 0 refills | Status: DC

## 2023-10-16 NOTE — ED Triage Notes (Signed)
 Pt with urinary frequency x 3d; hx of same; denies pain, discharge

## 2023-10-16 NOTE — ED Provider Notes (Signed)
 [place] [facility] Provider Note   CSN: [id number] Arrival date & time: 10/16/23  1117     History  Chief Complaint  Patient presents with   Urinary Frequency    Marcus Young is a 58 y.o. male.   Urinary Frequency   58 year old male presents to the emergency department complaints of urinary frequency, dysuria for the past 3 days.  Reports history of UTI and states this feels similar.  Denies any fevers, flank pain, abdominal pain, penile discharge, testicular pain, rash.  Denies any concern for STD.  States he has not followed with urology regarding his UTI; states that this happens about yearly and improved with antibiotics.  No significant pertinent past medical history.  Home Medications Prior to Admission medications   Medication Sig Start Date End Date Taking? Authorizing Provider  cefadroxil  (DURICEF) 500 MG capsule Take 1 capsule (500 mg total) by mouth 2 (two) times daily. 10/16/23  Yes Birdsong Butter, PA      Allergies    Patient has no known allergies.    Review of Systems   Review of Systems  Genitourinary:  Positive for frequency.  All other systems reviewed and are negative.   Physical Exam Updated Vital Signs BP 122/77   Pulse 72   Temp 98.7 F (37.1 C)   Resp 16   Ht 6' (1.829 m)   Wt 90.7 kg   SpO2 95%   BMI 27.12 kg/m  Physical Exam Vitals and nursing note reviewed.  Constitutional:      General: He is not in acute distress.    Appearance: He is well-developed.  HENT:     Head: Normocephalic and atraumatic.  Eyes:     Conjunctiva/sclera: Conjunctivae normal.  Cardiovascular:     Rate and Rhythm: Normal rate and regular rhythm.     Heart sounds: No murmur heard. Pulmonary:     Effort: Pulmonary effort is normal. No respiratory distress.     Breath sounds: Normal breath sounds.  Abdominal:     Palpations: Abdomen is soft.     Tenderness: There is no abdominal tenderness. There is no right CVA  tenderness, left CVA tenderness or guarding.  Musculoskeletal:        General: No swelling.     Cervical back: Neck supple.  Skin:    General: Skin is warm and dry.     Capillary Refill: Capillary refill takes less than 2 seconds.  Neurological:     Mental Status: He is alert.  Psychiatric:        Mood and Affect: Mood normal.     ED Results / Procedures / Treatments   Labs (all labs ordered are listed, but only abnormal results are displayed) Labs Reviewed  URINALYSIS, ROUTINE W REFLEX MICROSCOPIC - Abnormal; Notable for the following components:      Result Value   Leukocytes,Ua SMALL (*)    All other components within normal limits  URINALYSIS, MICROSCOPIC (REFLEX) - Abnormal; Notable for the following components:   Bacteria, UA RARE (*)    All other components within normal limits  CBG MONITORING, ED - Abnormal; Notable for the following components:   Glucose-Capillary 111 (*)    All other components within normal limits  URINE CULTURE    EKG None  Radiology No results found.  Procedures Procedures    Medications Ordered in ED Medications - No data to display  ED Course/ Medical Decision Making/ A&P  Medical Decision Making Amount and/or Complexity of Data Reviewed Labs: ordered.  Risk Prescription drug management.   This patient presents to the ED for concern of urinary frequency/dysuria, this involves an extensive number of treatment options, and is a complaint that carries with it a high risk of complications and morbidity.  The differential diagnosis includes UTI, hyperglycemia, DKA, HHS, pyelonephritis, nephrolithiasis, other   Co morbidities that complicate the patient evaluation  See HPI   Additional history obtained:  Additional history obtained from EMR External records from outside source obtained and reviewed including hospital records   Lab Tests:  I Ordered, and personally interpreted labs.  The  pertinent results include: UA with bacteria, leukocytes present.  CBG of 111.  Urine culture pending   Imaging Studies ordered:  N/a   Cardiac Monitoring: / EKG:  N/a   Consultations Obtained:  N/a   Problem List / ED Course / Critical interventions / Medication management  Urinary frequency, dysuria Reevaluation of the patient showed that the patient stayed the same I have reviewed the patients home medicines and have made adjustments as needed   Social Determinants of Health:  Denies tobacco, licit drug use.   Test / Admission - Considered:  Urinary frequency, dysuria Vitals signs within normal range and stable throughout visit. Laboratory studies significant for: See above 58 year old male presents to the emergency department complaints of urinary frequency, dysuria for the past 3 days.  Reports history of UTI and states this feels similar.  Denies any fevers, flank pain, abdominal pain, penile discharge, testicular pain, rash.  Denies any concern for STD.  States he has not followed with urology regarding his UTI; states that this happens about yearly and improved with antibiotics. On exam, no abdominal tenderness; no CVA tenderness.  Low suspicion for pyelonephritis.  UA with bacteria as well as leukocytes present although with negative nitrite, WBC, RBC.  Will treat empirically with antibiotics given concern for UTI and will culture urine.  STD testing was declined.  Will recommend follow-up with urology in the outpatient setting.  CBG of 111; seems less likely related to hyperglycemia/DKA/HHS.  Treatment plan discussed with patient and he is understanding was agreeable to said plan.  Patient will well-appearing, afebrile in no acute distress. Worrisome signs and symptoms were discussed with the patient, and the patient acknowledged understanding to return to the ED if noticed. Patient was stable upon discharge.          Final Clinical Impression(s) / ED  Diagnoses Final diagnoses:  Dysuria    Rx / DC Orders ED Discharge Orders          Ordered    cefadroxil  (DURICEF) 500 MG capsule  2 times daily        10/16/23 1209              Otoe Butter, [place] 10/16/23 1215    Rolinda Climes, DO 10/16/23 1531

## 2023-10-16 NOTE — Discharge Instructions (Signed)
 As discussed, will place you on antibiotics given concern for UTI.  Will also culture urine.  Recommend follow-up with urology in the outpatient setting for reassessment.  Please not hesitate to return if the worrisome signs and symptoms we discussed become apparent.

## 2023-10-17 LAB — URINE CULTURE: Culture: NO GROWTH

## 2024-03-02 ENCOUNTER — Encounter (HOSPITAL_BASED_OUTPATIENT_CLINIC_OR_DEPARTMENT_OTHER): Payer: Self-pay

## 2024-03-02 ENCOUNTER — Emergency Department (HOSPITAL_BASED_OUTPATIENT_CLINIC_OR_DEPARTMENT_OTHER)
Admission: EM | Admit: 2024-03-02 | Discharge: 2024-03-02 | Disposition: A | Discharge status: Home / Self Care | Payer: Self-pay | Attending: Emergency Medicine | Admitting: Emergency Medicine

## 2024-03-02 ENCOUNTER — Other Ambulatory Visit: Payer: Self-pay

## 2024-03-02 ENCOUNTER — Other Ambulatory Visit (HOSPITAL_BASED_OUTPATIENT_CLINIC_OR_DEPARTMENT_OTHER): Payer: Self-pay

## 2024-03-02 DIAGNOSIS — R350 Frequency of micturition: Secondary | ICD-10-CM | POA: Insufficient documentation

## 2024-03-02 LAB — URINALYSIS, ROUTINE W REFLEX MICROSCOPIC
Bilirubin Urine: NEGATIVE
Glucose, UA: NEGATIVE mg/dL
Hgb urine dipstick: NEGATIVE
Ketones, ur: NEGATIVE mg/dL
Nitrite: NEGATIVE
Protein, ur: NEGATIVE mg/dL
Specific Gravity, Urine: 1.02 (ref 1.005–1.030)
pH: 5.5 (ref 5.0–8.0)

## 2024-03-02 LAB — URINALYSIS, MICROSCOPIC (REFLEX)

## 2024-03-02 LAB — CBG MONITORING, ED: Glucose-Capillary: 102 mg/dL — ABNORMAL HIGH (ref 70–99)

## 2024-03-02 MED ORDER — CEPHALEXIN 500 MG PO CAPS
500.0000 mg | ORAL_CAPSULE | Freq: Two times a day (BID) | ORAL | 0 refills | Status: AC
  Filled 2024-03-02: qty 14, 7d supply, fill #0

## 2024-03-02 NOTE — Discharge Instructions (Signed)
 You were given a prescription for antibiotics today.  Please take 1 tablet twice a day for the next 7 days.  Please go see Dr. Shona of urology as this is a recurrent symptom and will likely need further workup.

## 2024-03-02 NOTE — ED Triage Notes (Signed)
 Frequent urination for 3 days. Has not seen urologist for this reoccurring problem

## 2024-03-02 NOTE — ED Notes (Signed)
 Called lab for GC add on .

## 2024-03-02 NOTE — ED Notes (Signed)

## 2024-03-02 NOTE — ED Provider Notes (Signed)
 [place] [facility] Provider Note   CSN: [id number] Arrival date & time: 03/02/24  9196     Patient presents with: Urinary Frequency   Marcus Young is a 58 y.o. male.   58 year old male with a past medical history of urinary frequency presents to the ED with a chief urinary frequency.  Patient reports he is in school right now, spends more time in the restroom that he spends in the class.  He reports he was seen for this in the month of June, told to follow-up with the urologist however never made an appointment.  He continues to experience this daily.  He is sexually active reports he wears condoms all the time.  Is not having any dysuria, no hematuria, no lower abdominal pain, no penile discharge, no nausea vomiting or fevers.  No prior history of prostate problems.  The history is provided by the patient.  Urinary Frequency This is a new problem. The current episode started more than 1 week ago. The problem occurs constantly. The problem has not changed since onset.Pertinent negatives include no chest pain. Nothing aggravates the symptoms. Nothing relieves the symptoms.       Prior to Admission medications   Medication Sig Start Date End Date Taking? Authorizing Provider  cephALEXin  (KEFLEX ) 500 MG capsule Take 1 capsule (500 mg total) by mouth 2 (two) times daily for 7 days. 03/02/24 03/09/24 Yes Amariona Rathje, PA-C    Allergies: Patient has no known allergies.    Review of Systems  Constitutional:  Negative for chills and fever.  Cardiovascular:  Negative for chest pain.  Genitourinary:  Positive for frequency.    Updated Vital Signs BP (!) 131/94   Pulse 77   Temp 97.7 F (36.5 C)   Resp 16   Wt 88.5 kg   SpO2 97%   BMI 26.45 kg/m   Physical Exam Vitals and nursing note reviewed.  HENT:     Head: Normocephalic and atraumatic.     Nose: Nose normal.  Cardiovascular:     Rate and Rhythm: Normal rate.  Pulmonary:      Effort: Pulmonary effort is normal.  Abdominal:     General: Abdomen is flat.  Musculoskeletal:     Cervical back: Normal range of motion and neck supple.  Skin:    General: Skin is warm and dry.  Neurological:     Mental Status: He is alert and oriented to person, place, and time.     (all labs ordered are listed, but only abnormal results are displayed) Labs Reviewed  URINALYSIS, ROUTINE W REFLEX MICROSCOPIC - Abnormal; Notable for the following components:      Result Value   Leukocytes,Ua SMALL (*)    All other components within normal limits  URINALYSIS, MICROSCOPIC (REFLEX) - Abnormal; Notable for the following components:   Bacteria, UA FEW (*)    All other components within normal limits  CBG MONITORING, ED - Abnormal; Notable for the following components:   Glucose-Capillary 102 (*)    All other components within normal limits  URINE CULTURE  GC/CHLAMYDIA PROBE AMP ([place]) NOT AT [facility]    EKG: None  Radiology: No results found.   Procedures   Medications Ordered in the ED - No data to display  Clinical Course as of 03/02/24 1009  Thu Mar 02, 2024  0916 Bacteria, UA(!): FEW [JS]  [phone]): SMALL [JS]    Clinical Course User Index [JS] Gerry Heaphy, PA-C  Medical Decision Making Amount and/or Complexity of Data Reviewed Labs: ordered. Decision-making details documented in ED Course.  Risk Prescription drug management.    Patient presented to ED with chief complaint of urinary urgency, reports this has not been an ongoing issue for him for about a year.  Evaluated here 6 months ago and given a prescription for antibiotics which resolved his symptoms.  According to extensive review of records, I do see a visit with Dr. Shona of urology in the month of December 2024, in which he was educated on starting medication for urinary frequency.  He also had screening such as PSA that he would do with his PCP.  Back  then during physical exam prostate was noted to have no induration or nodules present.  He does not have any history of prior prostate CA. Normal PSA on 0.8 from 04/27/2023.    UA on today's visit does have small leukocytes, few bacteria, he is not concerned for secondary infection as he does wear protection as he states, but will add this to gonorrhea and chlamydia testing via urine.  We discussed short course of antibiotics likely will resolve symptoms for now however he will need strong follow-up with urology encouraged.  Requesting CBG, she is concerned that he may have diabetes although prior records do not state that he does.  CBG is 102, no concern for diabetes at this time.    He is agreeable with plan of treatment at this time, return precautions discussed at length.  Patient is hemodynamically stable for discharge.   Portions of this note were generated with Scientist, clinical (histocompatibility and immunogenetics). Dictation errors may occur despite best attempts at proofreading.   Final diagnoses:  Urinary frequency    ED Discharge Orders          Ordered    cephALEXin  (KEFLEX ) 500 MG capsule  2 times daily        03/02/24 0956               Marcus Harkleroad, PA-C 03/02/24 1009    Rogelia Jerilynn RAMAN, MD 03/12/24 1705

## 2024-03-02 NOTE — ED Notes (Signed)
 Lab notified of urine culture

## 2024-03-03 LAB — GC/CHLAMYDIA PROBE AMP (~~LOC~~) NOT AT ARMC
Chlamydia: NEGATIVE
Comment: NEGATIVE
Comment: NORMAL
Neisseria Gonorrhea: NEGATIVE

## 2024-03-03 LAB — URINE CULTURE: Culture: NO GROWTH

## 2024-03-10 ENCOUNTER — Emergency Department (HOSPITAL_BASED_OUTPATIENT_CLINIC_OR_DEPARTMENT_OTHER)
Admission: EM | Admit: 2024-03-10 | Discharge: 2024-03-10 | Disposition: A | Discharge status: Home / Self Care | Payer: Self-pay | Attending: Emergency Medicine | Admitting: Emergency Medicine

## 2024-03-10 ENCOUNTER — Encounter (HOSPITAL_BASED_OUTPATIENT_CLINIC_OR_DEPARTMENT_OTHER): Payer: Self-pay

## 2024-03-10 ENCOUNTER — Other Ambulatory Visit: Payer: Self-pay

## 2024-03-10 DIAGNOSIS — H6122 Impacted cerumen, left ear: Secondary | ICD-10-CM | POA: Insufficient documentation

## 2024-03-10 NOTE — ED Provider Notes (Signed)
 [place] [facility] Provider Note   CSN: [id number] Arrival date & time: 03/10/24  1128     Patient presents with: Hearing Loss   Marcus Young is a 58 y.o. male.   Patient to the emergency department today for evaluation of left ear hearing loss.  He denies any injuries.  No URI symptoms.  He has a has a history of earwax impaction.  No pain in the ear.       Prior to Admission medications   Not on File    Allergies: Patient has no known allergies.    Review of Systems  Updated Vital Signs BP 120/78   Pulse 79   Temp 98.2 F (36.8 C) (Oral)   Resp 16   SpO2 95%   Physical Exam Vitals and nursing note reviewed.  Constitutional:      Appearance: He is well-developed.  HENT:     Head: Normocephalic and atraumatic.     Right Ear: Hearing, tympanic membrane and ear canal normal.     Left Ear: Decreased hearing noted. No tenderness. There is impacted cerumen.     Ears:     Comments: Left ear canal cerumen impaction Eyes:     Conjunctiva/sclera: Conjunctivae normal.  Pulmonary:     Effort: No respiratory distress.  Musculoskeletal:     Cervical back: Normal range of motion and neck supple.  Skin:    General: Skin is warm and dry.  Neurological:     Mental Status: He is alert.     (all labs ordered are listed, but only abnormal results are displayed) Labs Reviewed - No data to display  EKG: None  Radiology: No results found.   Procedures   Medications Ordered in the ED - No data to display  ED Course  Patient seen and examined. History obtained directly from patient.   Labs/EKG: None ordered  Imaging: None ordered  Medications/Fluids: None ordered  Most recent vital signs reviewed and are as follows: BP 120/78   Pulse 79   Temp 98.2 F (36.8 C) (Oral)   Resp 16   SpO2 95%   Initial impression: Cerumen impaction, left ear.  Will attempt to irrigate.  Discussed with patient he may need to continue  measures at home.  1:19 PM Reassessment performed. Patient appears stable, states that he feels much improved, hearing at baseline.  TM injected but no signs of infection.  Reviewed pertinent lab work and imaging with patient at bedside. Questions answered.   Most current vital signs reviewed and are as follows: BP 120/78   Pulse 79   Temp 98.2 F (36.8 C) (Oral)   Resp 16   SpO2 95%   Plan: Discharge to home.   Prescriptions written for: None  Other home care instructions discussed: Discussed use of water/peroxide 1:1 ratio as needed for softening of wax in future  ED return instructions discussed: New or worsening symptoms  Follow-up instructions discussed: Patient encouraged to follow-up with their PCP/ENT as needed.                                  Medical Decision Making  Cerumen impaction, cleared, symptoms improved.  Patient at baseline.  No evidence of otitis media/externa     Final diagnoses:  Hearing loss due to cerumen impaction, left    ED Discharge Orders     None  Desiderio Chew, PA-C 03/10/24 1320    Tonia Chew, MD 03/10/24 [phone]

## 2024-03-10 NOTE — ED Triage Notes (Signed)
 Pt unable to hear out of left ear since this am. Feels clogged. Pt did place drops in ear. Denies pain
